# Patient Record
Sex: Female | Born: 1975 | Race: White | Hispanic: No | Marital: Married | Smoking: Current some day smoker
Health system: Southern US, Community
[De-identification: ages and names within clinical notes are randomized; demographics above are authoritative.]

## PROBLEM LIST (undated history)

## (undated) DIAGNOSIS — R55 Syncope and collapse: Secondary | ICD-10-CM

## (undated) DIAGNOSIS — J45909 Unspecified asthma, uncomplicated: Secondary | ICD-10-CM

## (undated) HISTORY — PX: PLACEMENT OF BREAST IMPLANTS: SHX6334

## (undated) HISTORY — PX: OTHER SURGICAL HISTORY: SHX169

## (undated) HISTORY — PX: APPENDECTOMY: SHX54

## (undated) HISTORY — PX: CHOLECYSTECTOMY: SHX55

## (undated) HISTORY — PX: AUGMENTATION MAMMAPLASTY: SUR837

## (undated) HISTORY — PX: TUBAL LIGATION: SHX77

## (undated) HISTORY — DX: Syncope and collapse: R55

---

## 1997-12-06 ENCOUNTER — Emergency Department (HOSPITAL_COMMUNITY): Admission: EM | Admit: 1997-12-06 | Discharge: 1997-12-06 | Payer: Self-pay | Admitting: Emergency Medicine

## 1998-08-12 ENCOUNTER — Emergency Department (HOSPITAL_COMMUNITY): Admission: EM | Admit: 1998-08-12 | Discharge: 1998-08-12 | Payer: Self-pay | Admitting: Emergency Medicine

## 1998-08-17 ENCOUNTER — Emergency Department (HOSPITAL_COMMUNITY): Admission: EM | Admit: 1998-08-17 | Discharge: 1998-08-17 | Payer: Self-pay | Admitting: Emergency Medicine

## 1999-06-08 ENCOUNTER — Encounter: Payer: Self-pay | Admitting: Emergency Medicine

## 1999-06-08 ENCOUNTER — Emergency Department (HOSPITAL_COMMUNITY): Admission: EM | Admit: 1999-06-08 | Discharge: 1999-06-08 | Payer: Self-pay | Admitting: Emergency Medicine

## 1999-06-08 ENCOUNTER — Encounter: Payer: Self-pay | Admitting: General Surgery

## 2000-07-28 ENCOUNTER — Emergency Department (HOSPITAL_COMMUNITY): Admission: EM | Admit: 2000-07-28 | Discharge: 2000-07-28 | Payer: Self-pay | Admitting: Emergency Medicine

## 2000-07-28 ENCOUNTER — Encounter: Payer: Self-pay | Admitting: Emergency Medicine

## 2000-08-12 ENCOUNTER — Emergency Department (HOSPITAL_COMMUNITY): Admission: EM | Admit: 2000-08-12 | Discharge: 2000-08-12 | Payer: Self-pay | Admitting: Emergency Medicine

## 2000-10-20 ENCOUNTER — Emergency Department (HOSPITAL_COMMUNITY): Admission: EM | Admit: 2000-10-20 | Discharge: 2000-10-20 | Payer: Self-pay

## 2001-02-26 ENCOUNTER — Emergency Department (HOSPITAL_COMMUNITY): Admission: EM | Admit: 2001-02-26 | Discharge: 2001-02-26 | Payer: Self-pay | Admitting: Emergency Medicine

## 2001-05-15 ENCOUNTER — Emergency Department (HOSPITAL_COMMUNITY): Admission: EM | Admit: 2001-05-15 | Discharge: 2001-05-15 | Payer: Self-pay

## 2001-06-11 ENCOUNTER — Emergency Department (HOSPITAL_COMMUNITY): Admission: EM | Admit: 2001-06-11 | Discharge: 2001-06-11 | Payer: Self-pay | Admitting: *Deleted

## 2002-06-08 ENCOUNTER — Emergency Department (HOSPITAL_COMMUNITY): Admission: EM | Admit: 2002-06-08 | Discharge: 2002-06-08 | Payer: Self-pay | Admitting: Emergency Medicine

## 2003-08-16 ENCOUNTER — Other Ambulatory Visit: Admission: RE | Admit: 2003-08-16 | Discharge: 2003-08-16 | Payer: Self-pay | Admitting: Family Medicine

## 2003-09-09 ENCOUNTER — Emergency Department (HOSPITAL_COMMUNITY): Admission: EM | Admit: 2003-09-09 | Discharge: 2003-09-09 | Payer: Self-pay | Admitting: Emergency Medicine

## 2004-07-23 ENCOUNTER — Emergency Department: Payer: Self-pay | Admitting: Emergency Medicine

## 2007-09-11 ENCOUNTER — Other Ambulatory Visit: Payer: Self-pay

## 2007-09-11 ENCOUNTER — Emergency Department: Payer: Self-pay | Admitting: Emergency Medicine

## 2007-09-15 ENCOUNTER — Ambulatory Visit: Payer: Self-pay | Admitting: Family Medicine

## 2011-08-02 DIAGNOSIS — R55 Syncope and collapse: Secondary | ICD-10-CM | POA: Insufficient documentation

## 2013-07-31 ENCOUNTER — Emergency Department: Payer: Self-pay | Admitting: Emergency Medicine

## 2013-07-31 LAB — CBC WITH DIFFERENTIAL/PLATELET
Basophil #: 0.1 10*3/uL (ref 0.0–0.1)
Basophil %: 0.7 %
Eosinophil #: 0.2 10*3/uL (ref 0.0–0.7)
Eosinophil %: 1.1 %
HCT: 42.8 % (ref 35.0–47.0)
HGB: 14.8 g/dL (ref 12.0–16.0)
Lymphocyte #: 2.8 10*3/uL (ref 1.0–3.6)
Lymphocyte %: 16.9 %
MCH: 32.2 pg (ref 26.0–34.0)
MCHC: 34.7 g/dL (ref 32.0–36.0)
MCV: 93 fL (ref 80–100)
Monocyte #: 0.9 x10 3/mm (ref 0.2–0.9)
Monocyte %: 5.5 %
Neutrophil #: 12.7 10*3/uL — ABNORMAL HIGH (ref 1.4–6.5)
Neutrophil %: 75.8 %
Platelet: 239 10*3/uL (ref 150–440)
RBC: 4.61 10*6/uL (ref 3.80–5.20)
RDW: 12.8 % (ref 11.5–14.5)
WBC: 16.8 10*3/uL — ABNORMAL HIGH (ref 3.6–11.0)

## 2013-07-31 LAB — COMPREHENSIVE METABOLIC PANEL
Albumin: 4.1 g/dL (ref 3.4–5.0)
Alkaline Phosphatase: 57 U/L
Anion Gap: 6 — ABNORMAL LOW (ref 7–16)
BUN: 12 mg/dL (ref 7–18)
Bilirubin,Total: 0.4 mg/dL (ref 0.2–1.0)
Calcium, Total: 9.7 mg/dL (ref 8.5–10.1)
Chloride: 105 mmol/L (ref 98–107)
Co2: 24 mmol/L (ref 21–32)
Creatinine: 0.78 mg/dL (ref 0.60–1.30)
EGFR (African American): >60
EGFR (Non-African Amer.): >60
Glucose: 130 mg/dL — ABNORMAL HIGH (ref 65–99)
Osmolality: 272 (ref 275–301)
Potassium: 4 mmol/L (ref 3.5–5.1)
SGOT(AST): 28 U/L (ref 15–37)
SGPT (ALT): 24 U/L (ref 12–78)
Sodium: 135 mmol/L — ABNORMAL LOW (ref 136–145)
Total Protein: 7.5 g/dL (ref 6.4–8.2)

## 2013-07-31 LAB — URINALYSIS, COMPLETE
Bacteria: NONE SEEN
Bilirubin,UR: NEGATIVE
Blood: NEGATIVE
Glucose,UR: NEGATIVE mg/dL (ref 0–75)
Ketone: NEGATIVE
Leukocyte Esterase: NEGATIVE
Nitrite: NEGATIVE
Ph: 5 (ref 4.5–8.0)
Protein: NEGATIVE
RBC,UR: <1 /HPF (ref 0–5)
Specific Gravity: 1.024 (ref 1.003–1.030)
Squamous Epithelial: NONE SEEN
WBC UR: 1 /HPF (ref 0–5)

## 2013-08-01 ENCOUNTER — Observation Stay: Payer: Self-pay | Admitting: Surgery

## 2013-08-01 HISTORY — PX: LAPAROSCOPIC APPENDECTOMY: SHX408

## 2013-08-01 LAB — COMPREHENSIVE METABOLIC PANEL
Albumin: 4 g/dL (ref 3.4–5.0)
Alkaline Phosphatase: 69 U/L
Anion Gap: 3 — ABNORMAL LOW (ref 7–16)
BUN: 12 mg/dL (ref 7–18)
Bilirubin,Total: 0.8 mg/dL (ref 0.2–1.0)
Calcium, Total: 8.9 mg/dL (ref 8.5–10.1)
Chloride: 103 mmol/L (ref 98–107)
Co2: 28 mmol/L (ref 21–32)
Creatinine: 0.84 mg/dL (ref 0.60–1.30)
EGFR (African American): >60
EGFR (Non-African Amer.): >60
Glucose: 117 mg/dL — ABNORMAL HIGH (ref 65–99)
Osmolality: 269 (ref 275–301)
Potassium: 4 mmol/L (ref 3.5–5.1)
SGOT(AST): 25 U/L (ref 15–37)
SGPT (ALT): 22 U/L (ref 12–78)
Sodium: 134 mmol/L — ABNORMAL LOW (ref 136–145)
Total Protein: 7.7 g/dL (ref 6.4–8.2)

## 2013-08-01 LAB — URINALYSIS, COMPLETE
BACTERIA: NONE SEEN
BILIRUBIN, UR: NEGATIVE
Blood: NEGATIVE
Glucose,UR: NEGATIVE mg/dL (ref 0–75)
Ketone: NEGATIVE
Leukocyte Esterase: NEGATIVE
Nitrite: NEGATIVE
Ph: 5 (ref 4.5–8.0)
Protein: NEGATIVE
RBC,UR: 1 /HPF (ref 0–5)
Specific Gravity: 1.005 (ref 1.003–1.030)
WBC UR: <1 /HPF (ref 0–5)

## 2013-08-01 LAB — CBC WITH DIFFERENTIAL/PLATELET
Basophil #: 0.1 10*3/uL (ref 0.0–0.1)
Basophil %: 0.7 %
Eosinophil #: 0.2 10*3/uL (ref 0.0–0.7)
Eosinophil %: 1.2 %
HCT: 42.6 % (ref 35.0–47.0)
HGB: 14.6 g/dL (ref 12.0–16.0)
Lymphocyte #: 2.3 10*3/uL (ref 1.0–3.6)
Lymphocyte %: 16.8 %
MCH: 32.1 pg (ref 26.0–34.0)
MCHC: 34.2 g/dL (ref 32.0–36.0)
MCV: 94 fL (ref 80–100)
Monocyte #: 0.9 x10 3/mm (ref 0.2–0.9)
Monocyte %: 6.2 %
Neutrophil #: 10.4 10*3/uL — ABNORMAL HIGH (ref 1.4–6.5)
Neutrophil %: 75.1 %
Platelet: 233 10*3/uL (ref 150–440)
RBC: 4.54 10*6/uL (ref 3.80–5.20)
RDW: 12.6 % (ref 11.5–14.5)
WBC: 13.8 10*3/uL — ABNORMAL HIGH (ref 3.6–11.0)

## 2013-08-01 LAB — LIPASE, BLOOD: Lipase: 122 U/L (ref 73–393)

## 2013-08-03 LAB — PATHOLOGY REPORT

## 2014-11-19 NOTE — Op Note (Signed)
PATIENT NAME:  Taylor HaberURNER, Jianni N MR#:  [id number] DATE OF BIRTH:  [birth year]  DATE OF PROCEDURE:  08/01/2013  PREOPERATIVE DIAGNOSIS: Acute retrocecal appendicitis.   POSTOPERATIVE DIAGNOSIS: Acute retrocecal appendicitis.    PROCEDURE PERFORMED: Laparoscopic appendectomy.   SURGEON: French Kendra A. Egbert GaribaldiBird, M.D.   ASSISTANT: Surgical scrub technologist.   TYPE OF ANESTHESIA: General oroendotracheal.   FINDINGS: Retrocecal appendicitis with early gangrenous changes in the tip of the appendix.   SPECIMEN: Appendix to pathology.   ESTIMATED BLOOD LOSS: 25 mL.   DRAINS: None.   COUNTS: Lap and needle counts correct x 2.   DESCRIPTION OF PROCEDURE: With informed consent, supine position and general oroendotracheal anesthesia, the patient's abdomen was widely prepped and draped with ChloraPrep solution. The left arm was tucked and padded at her side, and timeout was observed.   A 12 mm Hasson trocar was placed through an open technique through an infraumbilical transversely-oriented skin incision, stay sutures being passed through the abdominal midline fascia. Pneumoperitoneum was established. Laparoscopic evaluation of the abdomen demonstrated no findings consistent with free perforation. A 5 mm Bladeless trocar was placed in the right upper mid abdomen. A 5 mm Bladeless trocar was placed in the left lower mid abdomen. The patient was then positioned in Trendelenburg and airplaned right side up. The base of the appendix was clearly identified. A window was fashioned at the base of the appendix with blunt technique. Utilizing an endoscopic 35 mm blue load stapler, the base of the appendix was transected at the confluence of the tinea. The appendix was in a retrocecal orientation. The mesoappendix was carefully divided dissecting it off the pericolonic fat as well as retroperitoneal attachments and this was achieved with the use of the laparoscopic Harmonic scalpel. The right upper quadrant and right  pericolic gutter were copiously irrigated and aspirated dry. Hemostasis appeared to be adequate on the operative field. The specimen was captured in an Endo Catch device and retrieved. Evaluation of the abdomen at this point demonstrated no evidence of bowel injury. Hemostasis appeared to be adequate.   Ports were then removed. The infraumbilical fascial defect was closed with several interrupted #0 Vicryl sutures on UR5 application. A total of 30 mL of 0.25% plain Marcaine was infiltrated along all skin and fascial incisions prior to closure. A 4-0 Vicryl subcuticular was applied to all skin edges. Benzoin, Steri-Strips, Telfa and Tegaderm were then applied, and the patient was subsequently extubated and taken to the recovery room in stable and satisfactory condition by anesthesia services.   ____________________________ Redge GainerMark A. Egbert GaribaldiBird, MD mab:gb D: 08/01/2013 16:44:12 ET T: 08/01/2013 23:20:55 ET JOB#: [id number]  cc: Loraine LericheMark A. Egbert GaribaldiBird, MD, <Dictator> Laramie Meissner A Kamrin Sibley MD ELECTRONICALLY SIGNED 08/02/2013 7:18

## 2014-11-19 NOTE — H&P (Signed)
Subjective/Chief Complaint abdominal pain nausea and vomiting   History of Present Illness 39 y/o female with hx of neurocardiogenic syncope presents with 5 day history of nausea, emesis and worsening abdominal pain pain initially in RUQ then today more in here right mid abdomen and flank, no sick contacts, no diarrhea, no dysuria   Past History as above tubal ligation abdominoplasty.   Past Med/Surgical Hx:  cardiac cath:   Asthma:   bradycardia:   Tachycardia:   cardiac arrythmia:   orthostatic hypertension:   nuerocardiogenic ssyncope:   Breast Surgery: implants  Tubal Ligation:   Wisdom Tooth Extraction:   ALLERGIES:  Compazine: Unknown  HOME MEDICATIONS: Medication Instructions Status  ondansetron 4 mg tablet 1 tab(s) orally every 8 hours, As Needed Active  acetaminophen-oxyCODONE 325 mg-5 mg tablet 1 tab(s) orally every 4 hours- as needed for pain  Active  proamatine 41m three times a day  Active  flecainide 1062mtwice a day  Active  zoloft 7577maily  Active  acebutolol 200 mg oral capsule 1 cap(s) orally 2 times a day Active   Family and Social History:  Family History Non-Contributory   Social History positive  tobacco, negative ETOH   + Tobacco Current (within 1 year)   Place of Living Home   Review of Systems:  Subjective/Chief Complaint see above   Abdominal Pain Yes   Diarrhea No   SOB/DOE Yes   Tolerating Diet No  Nauseated   Medications/Allergies Reviewed Medications/Allergies reviewed   Physical Exam:  GEN no acute distress   HEENT pale conjunctivae, PERRL   NECK supple   RESP normal resp effort  clear BS   CARD regular rate   ABD positive tenderness  no hernia  soft  multiple scars, mild rovsings sign.   LYMPH negative neck   EXTR negative cyanosis/clubbing   SKIN normal to palpation   NEURO cranial nerves intact   PSYCH A+O to time, place, person, good insight   Lab Results: Hepatic:  04-Jan-15 09:18    Bilirubin, Total 0.8  Alkaline Phosphatase 69 (45-117 NOTE: New Reference Range 06/18/13)  SGPT (ALT) 22  SGOT (AST) 25  Total Protein, Serum 7.7  Albumin, Serum 4.0  Routine Chem:  04-Jan-15 09:17   Result Comment URINALYSIS - CANCELLED. NOT ENOUGH SAMPLE.  - MPG C/ BRIDGETT RN @ [id number]/15  - TO NOTIFY.  Result(s) reported on 01 Aug 2013 at 09:28AM.    09:18   Glucose, Serum  117  BUN 12  Creatinine (comp) 0.84  Sodium, Serum  134  Potassium, Serum 4.0  Chloride, Serum 103  CO2, Serum 28  Calcium (Total), Serum 8.9  Osmolality (calc) 269  eGFR (African American) >60  eGFR (Non-African American) >60 (eGFR values <93m10mn/1.73 m2 may be an indication of chronic kidney disease (CKD). Calculated eGFR is useful in patients with stable renal function. The eGFR calculation will not be reliable in acutely ill patients when serum creatinine is changing rapidly. It is not useful in  patients on dialysis. The eGFR calculation may not be applicable to patients at the low and high extremes of body sizes, pregnant women, and vegetarians.)  Anion Gap  3  Lipase 122 (Result(s) reported on 01 Aug 2013 at 09:47AM.)  Routine UA:  04-Jan-15 09:17   Color (UA) -  Clarity (UA) -  Glucose (UA) -  Bilirubin (UA) -  Ketones (UA) -  Specific Gravity (UA) -  Blood (UA) -  pH (UA) -  Protein (UA) -  Nitrite (UA) -  Leukocyte Esterase (UA) -  RBC (UA) -  WBC (UA) -  Bacteria (UA) -  Epithelial Cells (UA) -  Other 1 (UA) -  Other 2 (UA) -  Other 3 (UA) -  Other 4 (UA) -  Other 5 (UA) -  Other 6 (UA) - (Result(s) reported on 01 Aug 2013 at 09:28AM.)  Transitional Epithelial (UA) -  Renal Epithelial (UA) -  WBC Clump (UA) -  Dysmorphic Red Blood Cell (UA) -  Red Blood Cell Clump (UA) -  Mucous (UA) -  Trichomonas (UA) -  Budding Yeast (UA) -  Hyphae Yeast -  Hyaline Cast (UA) -  Granular Cast (UA) -  Epithelial Cast (UA) -  WBC Cast (UA) -  RBC Cast (UA) -  Cellular Cast  (UA) -  Broad Cast (UA) -  Waxy Cast (UA) -  Fatty Cast (UA) -  Amorphous Crystal (UA) -  Triple Phosphate Crystal (UA) -  Calcium Oxalate Crystal (UA) -  Uric Acid Crystal (UA) -  Calcium Phosphate Crystal (UA) -  Calcium Carbonate Crystal (UA) -  Cystine Crystal (UA) -  Leucine Crystal (UA) -  Tyrosine Crystal (UA) -  Fat (UA) -  Oval Fat Body (UA) -  Routine Hem:  04-Jan-15 09:18   WBC (CBC)  13.8  RBC (CBC) 4.54  Hemoglobin (CBC) 14.6  Hematocrit (CBC) 42.6  Platelet Count (CBC) 233  MCV 94  MCH 32.1  MCHC 34.2  RDW 12.6  Neutrophil % 75.1  Lymphocyte % 16.8  Monocyte % 6.2  Eosinophil % 1.2  Basophil % 0.7  Neutrophil #  10.4  Lymphocyte # 2.3  Monocyte # 0.9  Eosinophil # 0.2  Basophil # 0.1 (Result(s) reported on 01 Aug 2013 at 09:36AM.)   Radiology Results: XRay:    03-Jan-15 07:54, Chest PA and Lateral  Chest PA and Lateral  REASON FOR EXAM:    R upper quadrant pain - elevated WBC  COMMENTS:       PROCEDURE: DXR - DXR CHEST PA (OR AP) AND LATERAL  - Jul 31 2013  7:54AM     CLINICAL DATA:  Right upper quadrant pain, leukocytosis    EXAM:  CHEST  2 VIEW    COMPARISON:  Right upper quadrant ultrasound performed earlier  today; report from a remote Chest x-ray dated September 18, 2001 is  available for review    FINDINGS:  The lungs are clear and negative for focal airspace consolidation,  pulmonary edema or suspicious pulmonary nodule. No pleural effusion  or pneumothorax. Cardiac and mediastinal contours are within normal  limits. No acute fracture or lytic or blastic osseous lesions. The  visualized upper abdominal bowel gas pattern is unremarkable.     IMPRESSION:  No active cardiopulmonary disease.      Electronically Signed    By: Jacqulynn Cadet M.D.    On: 07/31/2013 07:59       Verified By: Criselda Peaches, M.D.,  [place]:    03-Jan-15 07:14, US Abdomen Limited Survey  US Abdomen Limited Survey  REASON FOR EXAM:    severe RUQ  pain, h/o "sludge"  COMMENTS:   Body Site: Gallbladder, Liver, Common Bile Duct    PROCEDURE: [place]  - US ABDOMEN LIMITED SURVEY  - Jul 31 2013  7:14AM     CLINICAL DATA:  Severe right upper quadrant pain, history of  gallbladder sludge    EXAM:  US ABDOMEN LIMITED - RIGHT UPPER QUADRANT  COMPARISON:  Prior abdominal ultrasound 09/09/2003    FINDINGS:  Gallbladder  No gallstones or wall thickening visualized. No sonographic Murphy  sign noted.    Common bile duct    Diameter: Within normal limits 3.2 mm.    Liver:    No focal lesion identified. Within normal limits in parenchymal  echogenicity. Patent main portal vein with normal hepatopetal flow.     IMPRESSION:  Normal right upper quadrant ultrasound.  Electronically Signed    By: Jacqulynn Cadet M.D.    On: 07/31/2013 07:18         Verified By: Criselda Peaches, M.D.,  LabUnknown:  PACS Image    03-Jan-15 07:54, Chest PA and Lateral  PACS Image    04-Jan-15 12:14, CT Abdomen and Pelvis With Contrast  PACS Image  CT:  CT Abdomen and Pelvis With Contrast  REASON FOR EXAM:    (1) abd pain - R abd - moving into R lower; (2) abd   pain - R abd - moving into R  COMMENTS:       PROCEDURE: CT  - CT ABDOMEN / PELVIS  W  - Aug 01 2013 12:14PM     CLINICAL DATA:  Abdominal pain.    EXAM:  CT ABDOMEN AND PELVISWITH CONTRAST    TECHNIQUE:  Multidetector CT imaging of the abdomen and pelvis was performed  using the standard protocol following bolus administration of  intravenous contrast.  CONTRAST:  125 mm of Isovue 370.    COMPARISON:  No priors.    FINDINGS:  Lung Bases: Bilateral breast implants.  Otherwise, unremarkable.    Abdomen/Pelvis: The appearance of the liver, gallbladder, pancreas,  bilateral adrenal glands and the right kidney is unremarkable. Tiny  sub cm low-attenuation lesions in the upper pole of the right kidney  are too small to definitively characterize, but are  statistically  likely to represent tiny cysts. There is a 8 mm indeterminate  low-attenuation lesion in the spleen, which is incompletely  characterized, but likely a benign lesion.  The appendix is retrocecal in position. The tip of the appendix  appears dilated and markedly inflamed, measuring up to 1.3 cm in  diameter. There is surrounding stranding in the retroperitoneal fat  posterior to the hepatic flexure of the colon and anterior to the  right kidney. Small locules of gas in this region appear to be in  the tip of the appendix. The majority of the appendiceal mucosa  enhances normally, however, in the region of the tip of the appendix  medially there is asmall focus of nonenhancement, compatible with  an ischemic region.    No significant volume of ascites. No pneumoperitoneum. No pathologic  distention of small bowel. No definite lymphadenopathy identified  within the abdomen or pelvis. Uterus and ovaries are unremarkable in  appearance. A tampon is present in the vagina. Urinary bladder is  unremarkable in appearance.  Musculoskeletal: There are no aggressive appearing lytic or blastic  lesions noted in the visualized portions of the skeleton.     IMPRESSION:  1. The appendix is retrocecal in position, and there is definite  evidence of severe acute tip appendicitis. The appearance could  suggest early perforation, however, this is not yet definitive.  Surgical consultation is strongly recommended.  Critical Value/emergent results were called by telephone at the time  of interpretation on 08/01/2013 at 12:24 PM to Dr. Francene Castle, who  verbally acknowledged these results.      Electronically Signed  By: Vinnie Langton M.D.    On: 08/01/2013 12:28         Verified By: Etheleen Mayhew, M.D.,    Assessment/Admission Diagnosis 39 y/o with retrocecal appendicitis   Plan lap appendectomy, discussed retrocecal nature of anatomy. IV zosn, npo.   Electronic  Signatures: Sherri Rad (MD)  (Signed 04-Jan-15 13:35)  Authored: CHIEF COMPLAINT and HISTORY, PAST MEDICAL/SURGIAL HISTORY, ALLERGIES, HOME MEDICATIONS, FAMILY AND SOCIAL HISTORY, REVIEW OF SYSTEMS, PHYSICAL EXAM, LABS, Radiology, ASSESSMENT AND PLAN   Last Updated: 04-Jan-15 13:35 by Sherri Rad (MD)

## 2015-09-13 DIAGNOSIS — G90A Postural orthostatic tachycardia syndrome (POTS): Secondary | ICD-10-CM | POA: Insufficient documentation

## 2015-09-13 DIAGNOSIS — J45909 Unspecified asthma, uncomplicated: Secondary | ICD-10-CM | POA: Insufficient documentation

## 2015-09-13 DIAGNOSIS — R000 Tachycardia, unspecified: Secondary | ICD-10-CM | POA: Insufficient documentation

## 2019-08-11 ENCOUNTER — Ambulatory Visit: Payer: Medicare Other | Attending: Internal Medicine

## 2019-08-11 DIAGNOSIS — Z20822 Contact with and (suspected) exposure to covid-19: Secondary | ICD-10-CM

## 2019-08-12 LAB — NOVEL CORONAVIRUS, NAA: SARS-CoV-2, NAA: NOT DETECTED

## 2019-09-02 ENCOUNTER — Ambulatory Visit: Payer: Medicare Other | Attending: Internal Medicine

## 2019-09-02 DIAGNOSIS — Z23 Encounter for immunization: Secondary | ICD-10-CM | POA: Insufficient documentation

## 2019-09-02 NOTE — Progress Notes (Signed)
   Covid-19 Vaccination Clinic  Name:  Taylor Copeland    MRN: [id number] DOB: 1975/12/01  09/02/2019  Taylor Copeland was observed post Covid-19 immunization for 15 minutes without incidence. She was provided with Vaccine Information Sheet and instruction to access the V-Safe system.   Taylor Copeland was instructed to call 911 with any severe reactions post vaccine: Marland Kitchen Difficulty breathing  . Swelling of your face and throat  . A fast heartbeat  . A bad rash all over your body  . Dizziness and weakness    Immunizations Administered    Name Date Dose VIS Date Route   Pfizer COVID-19 Vaccine 09/02/2019  5:50 PM 0.3 mL 07/09/2019 Intramuscular   Manufacturer: [employer], [employer]   Lot: [id number]   NDC: [address]-9

## 2019-09-28 ENCOUNTER — Ambulatory Visit: Payer: Medicare Other | Attending: Internal Medicine

## 2019-09-28 DIAGNOSIS — Z23 Encounter for immunization: Secondary | ICD-10-CM | POA: Insufficient documentation

## 2019-09-28 NOTE — Progress Notes (Signed)
   Covid-19 Vaccination Clinic  Name:  BENA KOBEL    MRN: [id number] DOB: 1976-06-10  09/28/2019  Ms. Dekoning was observed post Covid-19 immunization for 15 minutes without incident. She was provided with Vaccine Information Sheet and instruction to access the V-Safe system.   Ms. Manganelli was instructed to call 911 with any severe reactions post vaccine: Marland Kitchen Difficulty breathing  . Swelling of face and throat  . A fast heartbeat  . A bad rash all over body  . Dizziness and weakness   Immunizations Administered    Name Date Dose VIS Date Route   Pfizer COVID-19 Vaccine 09/28/2019  9:10 AM 0.3 mL 07/09/2019 Intramuscular   Manufacturer: [employer], [employer]   Lot: [id number]   NDC: [address]-7

## 2020-02-11 ENCOUNTER — Encounter
Admission: AD | Disposition: A | Discharge status: Home / Self Care | Payer: Self-pay | Source: Ambulatory Visit | Attending: Surgery

## 2020-02-11 ENCOUNTER — Other Ambulatory Visit: Payer: Self-pay

## 2020-02-11 ENCOUNTER — Observation Stay
Admission: AD | Admit: 2020-02-11 | Discharge: 2020-02-12 | Disposition: A | Discharge status: Home / Self Care | Payer: Medicare Other | Source: Ambulatory Visit | Attending: Surgery | Admitting: Surgery

## 2020-02-11 ENCOUNTER — Encounter: Payer: Self-pay | Admitting: Surgery

## 2020-02-11 ENCOUNTER — Ambulatory Visit (INDEPENDENT_AMBULATORY_CARE_PROVIDER_SITE_OTHER): Payer: Medicare Other | Admitting: Surgery

## 2020-02-11 ENCOUNTER — Observation Stay: Payer: Medicare Other | Admitting: Anesthesiology

## 2020-02-11 ENCOUNTER — Other Ambulatory Visit: Payer: Medicare Other

## 2020-02-11 DIAGNOSIS — K439 Ventral hernia without obstruction or gangrene: Principal | ICD-10-CM | POA: Insufficient documentation

## 2020-02-11 DIAGNOSIS — Z20822 Contact with and (suspected) exposure to covid-19: Secondary | ICD-10-CM | POA: Diagnosis not present

## 2020-02-11 DIAGNOSIS — Z79899 Other long term (current) drug therapy: Secondary | ICD-10-CM | POA: Diagnosis not present

## 2020-02-11 DIAGNOSIS — R1033 Periumbilical pain: Secondary | ICD-10-CM | POA: Diagnosis present

## 2020-02-11 DIAGNOSIS — K436 Other and unspecified ventral hernia with obstruction, without gangrene: Secondary | ICD-10-CM | POA: Diagnosis not present

## 2020-02-11 HISTORY — PX: XI ROBOTIC ASSISTED VENTRAL HERNIA: SHX6789

## 2020-02-11 LAB — COMPREHENSIVE METABOLIC PANEL
ALT: 12 U/L (ref 0–44)
AST: 16 U/L (ref 15–41)
Albumin: 4.2 g/dL (ref 3.5–5.0)
Alkaline Phosphatase: 35 U/L — ABNORMAL LOW (ref 38–126)
Anion gap: 8 (ref 5–15)
BUN: 12 mg/dL (ref 6–20)
CO2: 22 mmol/L (ref 22–32)
Calcium: 8.8 mg/dL — ABNORMAL LOW (ref 8.9–10.3)
Chloride: 106 mmol/L (ref 98–111)
Creatinine, Ser: 0.68 mg/dL (ref 0.44–1.00)
GFR calc Af Amer: >60 mL/min (ref >60)
GFR calc non Af Amer: >60 mL/min (ref >60)
Glucose, Bld: 84 mg/dL (ref 70–99)
Potassium: 3.7 mmol/L (ref 3.5–5.1)
Sodium: 136 mmol/L (ref 135–145)
Total Bilirubin: 0.8 mg/dL (ref 0.3–1.2)
Total Protein: 6.8 g/dL (ref 6.5–8.1)

## 2020-02-11 LAB — URINE DRUG SCREEN, QUALITATIVE (ARMC ONLY)
Amphetamines, Ur Screen: NOT DETECTED
Barbiturates, Ur Screen: NOT DETECTED
Benzodiazepine, Ur Scrn: NOT DETECTED
Cannabinoid 50 Ng, Ur ~~LOC~~: POSITIVE — AB
Cocaine Metabolite,Ur ~~LOC~~: NOT DETECTED
MDMA (Ecstasy)Ur Screen: NOT DETECTED
Methadone Scn, Ur: NOT DETECTED
Opiate, Ur Screen: POSITIVE — AB
Phencyclidine (PCP) Ur S: NOT DETECTED
Tricyclic, Ur Screen: NOT DETECTED

## 2020-02-11 LAB — CBC
HCT: 35.9 % — ABNORMAL LOW (ref 36.0–46.0)
Hemoglobin: 12.9 g/dL (ref 12.0–15.0)
MCH: 32 pg (ref 26.0–34.0)
MCHC: 35.9 g/dL (ref 30.0–36.0)
MCV: 89.1 fL (ref 80.0–100.0)
Platelets: 219 10*3/uL (ref 150–400)
RBC: 4.03 MIL/uL (ref 3.87–5.11)
RDW: 12.1 % (ref 11.5–15.5)
WBC: 9.2 10*3/uL (ref 4.0–10.5)
nRBC: 0 % (ref 0.0–0.2)

## 2020-02-11 LAB — SARS CORONAVIRUS 2 BY RT PCR (HOSPITAL ORDER, PERFORMED IN ~~LOC~~ HOSPITAL LAB): SARS Coronavirus 2: NEGATIVE

## 2020-02-11 LAB — PROTIME-INR
INR: 1 (ref 0.8–1.2)
Prothrombin Time: 12.8 seconds (ref 11.4–15.2)

## 2020-02-11 LAB — POCT PREGNANCY, URINE: Preg Test, Ur: NEGATIVE

## 2020-02-11 LAB — PREGNANCY, URINE: Preg Test, Ur: NEGATIVE

## 2020-02-11 SURGERY — REPAIR, HERNIA, VENTRAL, ROBOT-ASSISTED
Anesthesia: General

## 2020-02-11 MED ORDER — CHLORHEXIDINE GLUCONATE 0.12 % MT SOLN: 15.0000 mL | Freq: Once | OROMUCOSAL | Status: AC

## 2020-02-11 MED ORDER — ONDANSETRON 4 MG PO TBDP: 4.0000 mg | ORAL_TABLET | Freq: Four times a day (QID) | ORAL | Status: DC | PRN

## 2020-02-11 MED ORDER — SODIUM CHLORIDE 0.9 % IV SOLN
3.0000 g | Freq: Four times a day (QID) | INTRAVENOUS | Status: DC
  Administered 2020-02-11 – 2020-02-12 (×4): 3 g via INTRAVENOUS
  Filled 2020-02-11 (×6): qty 8
  Filled 2020-02-11: qty 3
  Filled 2020-02-11: qty 8
  Filled 2020-02-11: qty 3

## 2020-02-11 MED ORDER — PROMETHAZINE HCL 25 MG/ML IJ SOLN: 6.2500 mg | INTRAMUSCULAR | Status: DC | PRN

## 2020-02-11 MED ORDER — KETOROLAC TROMETHAMINE 30 MG/ML IJ SOLN
INTRAMUSCULAR | Status: AC
  Administered 2020-02-11: 30 mg via INTRAVENOUS
  Filled 2020-02-11: qty 1

## 2020-02-11 MED ORDER — FENTANYL CITRATE (PF) 100 MCG/2ML IJ SOLN
INTRAMUSCULAR | Status: AC
  Administered 2020-02-11: 25 ug via INTRAVENOUS
  Filled 2020-02-11: qty 2

## 2020-02-11 MED ORDER — ACETAMINOPHEN 500 MG PO TABS
1000.0000 mg | ORAL_TABLET | Freq: Four times a day (QID) | ORAL | Status: DC
  Administered 2020-02-12 (×3): 1000 mg via ORAL
  Filled 2020-02-11 (×3): qty 2

## 2020-02-11 MED ORDER — BUPIVACAINE LIPOSOME 1.3 % IJ SUSP
INTRAMUSCULAR | Status: DC | PRN
  Administered 2020-02-11: 20 mL

## 2020-02-11 MED ORDER — ONDANSETRON HCL 4 MG/2ML IJ SOLN
INTRAMUSCULAR | Status: AC
  Filled 2020-02-11: qty 2

## 2020-02-11 MED ORDER — FENTANYL CITRATE (PF) 100 MCG/2ML IJ SOLN
25.0000 ug | INTRAMUSCULAR | Status: DC | PRN
  Administered 2020-02-11: 25 ug via INTRAVENOUS
  Administered 2020-02-11: 50 ug via INTRAVENOUS

## 2020-02-11 MED ORDER — MIDAZOLAM HCL 2 MG/2ML IJ SOLN
INTRAMUSCULAR | Status: DC | PRN
  Administered 2020-02-11: 2 mg via INTRAVENOUS

## 2020-02-11 MED ORDER — MIDAZOLAM HCL 2 MG/2ML IJ SOLN
INTRAMUSCULAR | Status: AC
  Filled 2020-02-11: qty 2

## 2020-02-11 MED ORDER — FENTANYL CITRATE (PF) 100 MCG/2ML IJ SOLN
INTRAMUSCULAR | Status: AC
  Filled 2020-02-11: qty 2

## 2020-02-11 MED ORDER — ONDANSETRON HCL 4 MG/2ML IJ SOLN
INTRAMUSCULAR | Status: DC | PRN
  Administered 2020-02-11: 4 mg via INTRAVENOUS

## 2020-02-11 MED ORDER — LACTATED RINGERS IV SOLN: INTRAVENOUS | Status: DC

## 2020-02-11 MED ORDER — DEXAMETHASONE SODIUM PHOSPHATE 10 MG/ML IJ SOLN
INTRAMUSCULAR | Status: AC
  Filled 2020-02-11: qty 1

## 2020-02-11 MED ORDER — BUPIVACAINE LIPOSOME 1.3 % IJ SUSP
INTRAMUSCULAR | Status: AC
  Filled 2020-02-11: qty 20

## 2020-02-11 MED ORDER — ONDANSETRON HCL 4 MG/2ML IJ SOLN
4.0000 mg | Freq: Four times a day (QID) | INTRAMUSCULAR | Status: DC | PRN
  Administered 2020-02-11 (×2): 4 mg via INTRAVENOUS
  Filled 2020-02-11: qty 2

## 2020-02-11 MED ORDER — HYDROMORPHONE HCL 1 MG/ML IJ SOLN: 0.5000 mg | INTRAMUSCULAR | Status: DC | PRN

## 2020-02-11 MED ORDER — SUGAMMADEX SODIUM 200 MG/2ML IV SOLN
INTRAVENOUS | Status: DC | PRN
  Administered 2020-02-11: 200 mg via INTRAVENOUS

## 2020-02-11 MED ORDER — FENTANYL CITRATE (PF) 100 MCG/2ML IJ SOLN
INTRAMUSCULAR | Status: AC
  Administered 2020-02-11: 50 ug via INTRAVENOUS
  Filled 2020-02-11: qty 2

## 2020-02-11 MED ORDER — PROPOFOL 10 MG/ML IV BOLUS
INTRAVENOUS | Status: AC
  Filled 2020-02-11: qty 20

## 2020-02-11 MED ORDER — ROCURONIUM BROMIDE 100 MG/10ML IV SOLN
INTRAVENOUS | Status: DC | PRN
  Administered 2020-02-11: 30 mg via INTRAVENOUS
  Administered 2020-02-11: 50 mg via INTRAVENOUS
  Administered 2020-02-11: 20 mg via INTRAVENOUS

## 2020-02-11 MED ORDER — PROPOFOL 10 MG/ML IV BOLUS
INTRAVENOUS | Status: DC | PRN
  Administered 2020-02-11: 150 mg via INTRAVENOUS
  Administered 2020-02-11: 50 mg via INTRAVENOUS
  Administered 2020-02-11: 100 mg via INTRAVENOUS

## 2020-02-11 MED ORDER — KETOROLAC TROMETHAMINE 30 MG/ML IJ SOLN
30.0000 mg | Freq: Four times a day (QID) | INTRAMUSCULAR | Status: DC
  Administered 2020-02-12 (×3): 30 mg via INTRAVENOUS
  Filled 2020-02-11 (×3): qty 1

## 2020-02-11 MED ORDER — BUPIVACAINE-EPINEPHRINE 0.25% -1:200000 IJ SOLN
INTRAMUSCULAR | Status: DC | PRN
  Administered 2020-02-11: 30 mL

## 2020-02-11 MED ORDER — DEXAMETHASONE SODIUM PHOSPHATE 10 MG/ML IJ SOLN
INTRAMUSCULAR | Status: DC | PRN
  Administered 2020-02-11: 10 mg via INTRAVENOUS

## 2020-02-11 MED ORDER — FENTANYL CITRATE (PF) 100 MCG/2ML IJ SOLN
50.0000 ug | Freq: Once | INTRAMUSCULAR | Status: AC
  Administered 2020-02-11: 50 ug via INTRAVENOUS

## 2020-02-11 MED ORDER — PANTOPRAZOLE SODIUM 40 MG IV SOLR
40.0000 mg | Freq: Every day | INTRAVENOUS | Status: DC
  Administered 2020-02-11: 40 mg via INTRAVENOUS
  Filled 2020-02-11: qty 40

## 2020-02-11 MED ORDER — OXYCODONE HCL 5 MG PO TABS
5.0000 mg | ORAL_TABLET | ORAL | Status: DC | PRN
  Administered 2020-02-11 – 2020-02-12 (×3): 10 mg via ORAL
  Filled 2020-02-11 (×3): qty 2

## 2020-02-11 MED ORDER — LACTATED RINGERS IV SOLN
125.0000 mL/h | INTRAVENOUS | Status: DC
  Administered 2020-02-11: 125 mL/h via INTRAVENOUS

## 2020-02-11 MED ORDER — PHENYLEPHRINE HCL (PRESSORS) 10 MG/ML IV SOLN
INTRAVENOUS | Status: DC | PRN
  Administered 2020-02-11 (×2): 100 ug via INTRAVENOUS

## 2020-02-11 MED ORDER — POLYETHYLENE GLYCOL 3350 17 G PO PACK
17.0000 g | PACK | Freq: Every day | ORAL | Status: DC | PRN
  Administered 2020-02-12: 17 g via ORAL
  Filled 2020-02-11: qty 1

## 2020-02-11 MED ORDER — EPHEDRINE SULFATE 50 MG/ML IJ SOLN
INTRAMUSCULAR | Status: DC | PRN
  Administered 2020-02-11 (×3): 15 mg via INTRAVENOUS
  Administered 2020-02-11: 10 mg via INTRAVENOUS
  Administered 2020-02-11: 15 mg via INTRAVENOUS
  Administered 2020-02-11: 10 mg via INTRAVENOUS

## 2020-02-11 MED ORDER — FENTANYL CITRATE (PF) 100 MCG/2ML IJ SOLN
INTRAMUSCULAR | Status: DC | PRN
  Administered 2020-02-11: 50 ug via INTRAVENOUS
  Administered 2020-02-11: 100 ug via INTRAVENOUS
  Administered 2020-02-11: 50 ug via INTRAVENOUS

## 2020-02-11 MED ORDER — ORAL CARE MOUTH RINSE: 15.0000 mL | Freq: Once | OROMUCOSAL | Status: AC

## 2020-02-11 MED ORDER — LIDOCAINE HCL (CARDIAC) PF 100 MG/5ML IV SOSY
PREFILLED_SYRINGE | INTRAVENOUS | Status: DC | PRN
  Administered 2020-02-11: 100 mg via INTRAVENOUS

## 2020-02-11 MED ORDER — CHLORHEXIDINE GLUCONATE 0.12 % MT SOLN
OROMUCOSAL | Status: AC
  Administered 2020-02-11: 15 mL via OROMUCOSAL
  Filled 2020-02-11: qty 15

## 2020-02-11 MED ORDER — ENOXAPARIN SODIUM 40 MG/0.4ML ~~LOC~~ SOLN
40.0000 mg | SUBCUTANEOUS | Status: DC
  Administered 2020-02-12: 40 mg via SUBCUTANEOUS
  Filled 2020-02-11: qty 0.4

## 2020-02-11 MED ORDER — BUPIVACAINE-EPINEPHRINE (PF) 0.25% -1:200000 IJ SOLN
INTRAMUSCULAR | Status: AC
  Filled 2020-02-11: qty 30

## 2020-02-11 MED ORDER — OXYCODONE HCL 5 MG PO TABS
ORAL_TABLET | ORAL | Status: AC
  Administered 2020-02-11: 5 mg via ORAL
  Filled 2020-02-11: qty 1

## 2020-02-11 MED ORDER — ACETAMINOPHEN 500 MG PO TABS
ORAL_TABLET | ORAL | Status: AC
  Administered 2020-02-11: 1000 mg via ORAL
  Filled 2020-02-11: qty 2

## 2020-02-11 SURGICAL SUPPLY — 62 items
BLADE SURG SZ11 CARB STEEL (BLADE) ×3 IMPLANT
CANISTER SUCT 1200ML W/VALVE (MISCELLANEOUS) ×3 IMPLANT
CANNULA REDUC XI 12-8 STAPL (CANNULA) ×1
CANNULA REDUC XI 12-8MM STAPL (CANNULA) ×1
CANNULA REDUCER 12-8 DVNC XI (CANNULA) ×1 IMPLANT
CHLORAPREP W/TINT 26 (MISCELLANEOUS) ×3 IMPLANT
COVER TIP SHEARS 8 DVNC (MISCELLANEOUS) ×1 IMPLANT
COVER TIP SHEARS 8MM DA VINCI (MISCELLANEOUS) ×2
COVER WAND RF STERILE (DRAPES) IMPLANT
DEFOGGER SCOPE WARMER CLEARIFY (MISCELLANEOUS) ×3 IMPLANT
DERMABOND ADVANCED (GAUZE/BANDAGES/DRESSINGS) ×2
DERMABOND ADVANCED .7 DNX12 (GAUZE/BANDAGES/DRESSINGS) ×1 IMPLANT
DRAPE ARM DVNC X/XI (DISPOSABLE) ×4 IMPLANT
DRAPE COLUMN DVNC XI (DISPOSABLE) ×1 IMPLANT
DRAPE DA VINCI XI ARM (DISPOSABLE) ×8
DRAPE DA VINCI XI COLUMN (DISPOSABLE) ×2
ELECT CAUTERY BLADE 6.4 (BLADE) ×3 IMPLANT
ELECT REM PT RETURN 9FT ADLT (ELECTROSURGICAL) ×3
ELECTRODE REM PT RTRN 9FT ADLT (ELECTROSURGICAL) ×1 IMPLANT
GLOVE SURG SYN 7.0 (GLOVE) ×6 IMPLANT
GLOVE SURG SYN 7.5  E (GLOVE) ×4
GLOVE SURG SYN 7.5 E (GLOVE) ×2 IMPLANT
GOWN STRL REUS W/ TWL LRG LVL3 (GOWN DISPOSABLE) ×3 IMPLANT
GOWN STRL REUS W/TWL LRG LVL3 (GOWN DISPOSABLE) ×6
GRASPER SUT TROCAR 14GX15 (MISCELLANEOUS) ×3 IMPLANT
IRRIGATION STRYKERFLOW (MISCELLANEOUS) IMPLANT
IRRIGATOR STRYKERFLOW (MISCELLANEOUS)
IRRIGATOR SUCT 8 DISP DVNC XI (IRRIGATION / IRRIGATOR) ×1 IMPLANT
IRRIGATOR SUCTION 8MM XI DISP (IRRIGATION / IRRIGATOR) ×2
IV NS 1000ML (IV SOLUTION)
IV NS 1000ML BAXH (IV SOLUTION) IMPLANT
KIT PINK PAD W/HEAD ARE REST (MISCELLANEOUS) ×3
KIT PINK PAD W/HEAD ARM REST (MISCELLANEOUS) ×1 IMPLANT
LABEL OR SOLS (LABEL) ×3 IMPLANT
NEEDLE HYPO 22GX1.5 SAFETY (NEEDLE) ×3 IMPLANT
NEEDLE INSUFFLATION 14GA 120MM (NEEDLE) ×3 IMPLANT
OBTURATOR OPTICAL STANDARD 8MM (TROCAR) ×2
OBTURATOR OPTICAL STND 8 DVNC (TROCAR) ×1
OBTURATOR OPTICALSTD 8 DVNC (TROCAR) ×1 IMPLANT
PACK LAP CHOLECYSTECTOMY (MISCELLANEOUS) ×3 IMPLANT
PENCIL ELECTRO HAND CTR (MISCELLANEOUS) ×3 IMPLANT
SEAL CANN UNIV 5-8 DVNC XI (MISCELLANEOUS) ×2 IMPLANT
SEAL XI 5MM-8MM UNIVERSAL (MISCELLANEOUS) ×6
SEALER VESSEL DA VINCI XI (MISCELLANEOUS) ×2
SEALER VESSEL EXT DVNC XI (MISCELLANEOUS) ×1 IMPLANT
SET TUBE SMOKE EVAC HIGH FLOW (TUBING) ×3 IMPLANT
SOLUTION ELECTROLUBE (MISCELLANEOUS) ×3 IMPLANT
SPONGE LAP 18X18 RF (DISPOSABLE) ×3 IMPLANT
STAPLER CANNULA SEAL DVNC XI (STAPLE) ×1 IMPLANT
STAPLER CANNULA SEAL XI (STAPLE) ×2
SUT MNCRL 4-0 (SUTURE) ×2
SUT MNCRL 4-0 27XMFL (SUTURE) ×1
SUT STRATAFIX 0 PDS+ CT-2 23 (SUTURE) ×3
SUT STRATAFIX PDS 30 CT-1 (SUTURE) IMPLANT
SUT VIC AB 3-0 SH 27 (SUTURE) ×2
SUT VIC AB 3-0 SH 27X BRD (SUTURE) ×1 IMPLANT
SUT VICRYL 0 AB UR-6 (SUTURE) ×3 IMPLANT
SUT VLOC 90 2/L VL 12 GS22 (SUTURE) IMPLANT
SUTURE MNCRL 4-0 27XMF (SUTURE) ×1 IMPLANT
SUTURE STRATFX 0 PDS+ CT-2 23 (SUTURE) ×1 IMPLANT
TRAY FOLEY SLVR 16FR LF STAT (SET/KITS/TRAYS/PACK) ×3 IMPLANT
TROCAR 130MM GELPORT  DAV (MISCELLANEOUS) IMPLANT

## 2020-02-11 NOTE — H&P (Signed)
02/11/2020  Reason for Visit:  Incarcerated / strangulated ventral hernia  History of Present Illness: Taylor Copeland is a 44 y.o. female presenting for evaluation of abdominal pain.  Patient has a history of a laparoscopic appendectomy with Dr. Egbert Garibaldi in 2015.  She [place] was at the beach and when bending over, she felt pain in the umbilical area.  She has a known umbilical hernia, but the pain got worse and the bulging got worse.  She went to the local hospital and had a CT scan which she reports showed a fat containing umbilical hernia.  She then came back from the beach and went to [facility] to try to get established quicker for surgery.  [facility] ED requested another CT scan.  She had labwork which was otherwise normal WBC, normal lactic acid, but her CT scan showed signs concerning for strangulated fat containing umbilical hernia.  I am looking at the images personally and there's stranding and some fluid in the hernia sac.  It appears to contain omentum only and no intestine.  She was discharged home and given an appointment with surgery at [facility], but the appointment is not until 03/03/20.  She feels that she's in enough pain that she wanted to get in with a surgeon sooner and set up an appointment with [place].  She reports that she's needing the pain medication she was given at the hospitals and takes about 2-4 times per day.  She's tried Aleve but does not work as well as the narcotic pain medication.  She reports having some nausea and is using Zofran given.  Able to keep po intake and has bowel function.  Her pain is controlled with the medication and she's been avoiding any activity.  Past Medical History: Past Medical History:  Diagnosis Date  . Syncope      Past Surgical History: Past Surgical History:  Procedure Laterality Date  . APPENDECTOMY    . PLACEMENT OF BREAST IMPLANTS    . TUBAL LIGATION    . tummy tuck      Home Medications: Prior to Admission medications   Medication Sig Start Date  End Date Taking? Authorizing Provider  acebutolol (SECTRAL) 200 MG capsule Take by mouth. 02/05/17  Yes [provider]  albuterol (VENTOLIN HFA) 108 (90 Base) MCG/ACT inhaler Inhale into the lungs. 02/24/19  Yes [provider]  flecainide (TAMBOCOR) 100 MG tablet Take by mouth. 02/05/17  Yes [provider]  HYDROcodone-acetaminophen (NORCO/VICODIN) 5-325 MG tablet Take 1 tablet by mouth every 6 (six) hours as needed. 02/08/20  Yes [provider]  midodrine (PROAMATINE) 10 MG tablet Take by mouth. 02/05/17  Yes [provider]  ondansetron (ZOFRAN-ODT) 4 MG disintegrating tablet Take by mouth. 02/07/20 02/14/20 Yes [provider]  oxyCODONE-acetaminophen (PERCOCET/ROXICET) 5-325 MG tablet Take 1 tablet by mouth 4 (four) times daily as needed. 02/07/20  Yes [provider]  sertraline (ZOLOFT) 50 MG tablet One and one-half tablets daily 02/05/17  Yes [provider]    Allergies: Not on File  Social History:  reports that she has been smoking. She has never used smokeless tobacco. She reports current alcohol use. She reports current drug use. Drug: Marijuana.   Family History: Family History  Adopted: Yes    Review of Systems: Review of Systems  Constitutional: Negative for chills and fever.  HENT: Negative for hearing loss.   Respiratory: Negative for shortness of breath.   Cardiovascular: Negative for chest pain.  Gastrointestinal: Positive for abdominal  pain, nausea and vomiting. Negative for constipation and diarrhea.  Genitourinary: Negative for dysuria.  Musculoskeletal: Negative for myalgias.  Skin: Negative for rash.  Neurological: Negative for dizziness.  Psychiatric/Behavioral: Negative for depression.    Physical Exam Vitals:  Temp 98.1, BP 115/81, HR 61.  Height 5'4", Weight 172.6 lbs CONSTITUTIONAL: No acute distress HEENT:  Normocephalic, atraumatic, extraocular motion intact. NECK: Trachea is  midline, and there is no jugular venous distension.  RESPIRATORY:  Lungs are clear, and breath sounds are equal bilaterally. Normal respiratory effort without pathologic use of accessory muscles. CARDIOVASCULAR: Heart is regular without murmurs, gallops, or rubs. GI: The abdomen is soft, non-distended, with tenderness to palpation in the supraumbilical area, where there is an area of bulging, measuring about 8 cm wide.  Cannot fully palpate a hernia defect due to pain.  The hernia contents are not reducible.  The overlying skin is healthy without any erythema or induration or ulceration.  MUSCULOSKELETAL:  Normal muscle strength and tone in all four extremities.  No peripheral edema or cyanosis. SKIN: Skin turgor is normal. There are no pathologic skin lesions.  NEUROLOGIC:  Motor and sensation is grossly normal.  Cranial nerves are grossly intact. PSYCH:  Alert and oriented to person, place and time. Affect is normal.  Laboratory Analysis: Labs from [facility] 02/07/20: Na 138, K 3.8, Cl 105, CO2 27, BUN 14, Cr 0.63, LFTs within normal.  Lactic acid 0.8.  WBC 8.8, Hgb 13.5, Hct 40.4, Plt 282  Imaging: CT scan abdomen/pelvis at [facility] 02/07/20: IMPRESSION:  Large broad-based strangulated fat-containing ventral hernia. No bowel within the hernia sac.   The findings of this study were discussed via telephone with DR. Geoffery Lyons by Dr. Launa Flight on 02/07/2020 10:33 PM.   ADDENDUM:  -Prior appendectomy  -Common bile duct measures 7 mm (3:52) rather than the 13 mm preliminarily reported.   Assessment and Plan: This is a 44 y.o. female with abdominal pain with incarcerated vs strangulated ventral hernia.  --Discussed with the patient that I am unable to reduce her hernia.  She's not peritoneal or toxic at this moment, but discussed with her the option for admission directly to the hospital for hernia repair today vs tomorrow morning.  We could potentially do this in a semi-elective basis for mid next  week, but the patient prefers to be admitted.  I agree with that.   --Will admit to the surgery team, keep her NPO, start prophylactic IV abx, and schedule her for robotic ventral hernia repair late this afternoon as she had po intake at 9:30 am.  Discussed with her the risks of infection, bleeding, and injury to surrounding structures, the possibility for open surgery, the possibility of not being able to use mesh if there is concern for strangulation and compromised omentum.  She's in agreement and willing to proceed.   Howie Ill, MD [facility]

## 2020-02-11 NOTE — Brief Op Note (Signed)
02/11/2020  8:34 PM  PATIENT:  Taylor Copeland  44 y.o. female  PRE-OPERATIVE DIAGNOSIS:  Incarcerated Umbilical Hernia  POST-OPERATIVE DIAGNOSIS:  Incarcerated Umbilical Hernia  PROCEDURE:  Procedure(s): XI ROBOTIC ASSISTED VENTRAL HERNIA (N/A)  SURGEON:  Surgeon(s) and Role:    * Demecia Northway, MD - Primary  ANESTHESIA:   general  EBL:  30 mL   BLOOD ADMINISTERED:none  DRAINS: none   LOCAL MEDICATIONS USED:  BUPIVICAINE   SPECIMEN:  No Specimen  DISPOSITION OF SPECIMEN:  N/A  COUNTS:  YES  DICTATION: .Dragon Dictation  PLAN OF CARE: Admit to inpatient   PATIENT DISPOSITION:  PACU - hemodynamically stable.   Delay start of Pharmacological VTE agent (>24hrs) due to surgical blood loss or risk of bleeding: yes

## 2020-02-11 NOTE — Anesthesia Procedure Notes (Signed)
Procedure Name: Intubation Date/Time: 02/11/2020 5:46 PM Performed by: Irving Burton, CRNA Pre-anesthesia Checklist: Patient identified, Emergency Drugs available, Suction available and Patient being monitored Patient Re-evaluated:Patient Re-evaluated prior to induction Oxygen Delivery Method: Circle system utilized Preoxygenation: Pre-oxygenation with 100% oxygen Induction Type: IV induction Ventilation: Mask ventilation without difficulty Laryngoscope Size: McGraph and 3 Grade View: Grade I Tube type: Oral Tube size: 7.0 mm Number of attempts: 1 Airway Equipment and Method: Stylet and Video-laryngoscopy Placement Confirmation: ETT inserted through vocal cords under direct vision,  positive ETCO2 and breath sounds checked- equal and bilateral Secured at: 21 cm Tube secured with: Tape Dental Injury: Teeth and Oropharynx as per pre-operative assessment

## 2020-02-11 NOTE — Anesthesia Preprocedure Evaluation (Signed)
Anesthesia Evaluation  Patient identified by MRN, date of birth, ID band Patient awake    Reviewed: Allergy & Precautions, H&P , NPO status , Patient's Chart, lab work & pertinent test results, reviewed documented beta blocker date and time   History of Anesthesia Complications Negative for: history of anesthetic complications  Airway Mallampati: I  TM Distance: >3 FB Neck ROM: full    Dental  (+) Dental Advidsory Given, Teeth Intact   Pulmonary neg shortness of breath, asthma , neg COPD, neg recent URI, Current Smoker,    Pulmonary exam normal breath sounds clear to auscultation       Cardiovascular Exercise Tolerance: Good (-) hypertension(-) angina(-) Past MI and (-) Cardiac Stents Normal cardiovascular exam+ dysrhythmias (neurogenic syncope) (-) Valvular Problems/Murmurs Rhythm:regular Rate:Normal     Neuro/Psych negative neurological ROS  negative psych ROS   GI/Hepatic Neg liver ROS, GERD  ,  Endo/Other  negative endocrine ROS  Renal/GU negative Renal ROS  negative genitourinary   Musculoskeletal   Abdominal   Peds  Hematology negative hematology ROS (+)   Anesthesia Other Findings Past Medical History: No date: Syncope   Reproductive/Obstetrics negative OB ROS                             Anesthesia Physical Anesthesia Plan  ASA: II  Anesthesia Plan: General   Post-op Pain Management:    Induction: Intravenous  PONV Risk Score and Plan: 2 and Ondansetron, Dexamethasone, Midazolam and Promethazine  Airway Management Planned: Oral ETT  Additional Equipment:   Intra-op Plan:   Post-operative Plan: Extubation in OR  Informed Consent: I have reviewed the patients History and Physical, chart, labs and discussed the procedure including the risks, benefits and alternatives for the proposed anesthesia with the patient or authorized representative who has indicated his/her  understanding and acceptance.     Dental Advisory Given  Plan Discussed with: Anesthesiologist, CRNA and Surgeon  Anesthesia Plan Comments:         Anesthesia Quick Evaluation

## 2020-02-11 NOTE — Transfer of Care (Signed)
Immediate Anesthesia Transfer of Care Note  Patient: Taylor Copeland  Procedure(s) Performed: XI ROBOTIC ASSISTED VENTRAL HERNIA (N/A )  Patient Location: PACU  Anesthesia Type:General  Level of Consciousness: awake, alert  and oriented  Airway & Oxygen Therapy: Patient Spontanous Breathing  Post-op Assessment: Report given to RN and Post -op Vital signs reviewed and stable  Post vital signs: Reviewed and stable  Last Vitals:  Vitals Value Taken Time  BP 138/79 02/11/20 2028  Temp 36.7 C 02/11/20 2027  Pulse 79 02/11/20 2032  Resp 17 02/11/20 2031  SpO2 92 % 02/11/20 2032  Vitals shown include unvalidated device data.  Last Pain:  Vitals:   02/11/20 2027  PainSc: (P) Asleep         Complications: No complications documented.

## 2020-02-11 NOTE — Patient Instructions (Addendum)
Do not eat or drink anything.  Direct admit today for Umbilical Hernia repair for today or tomorrow.  Please wait at home until called to go to the hospital. They will call you on your cell phone number.  You will need to go to the [facility] Entrance and check in at Patient registration.     Hernia, Adult   A hernia happens when tissue inside your body pushes out through a weak spot in your belly muscles (abdominal wall). This makes a round lump (bulge). The lump may be:  In a scar from surgery that was done in your belly (incisional hernia).  Near your belly button (umbilical hernia).  In your groin (inguinal hernia). Your groin is the area where your leg meets your lower belly (abdomen). This kind of hernia could also be: ? In your scrotum, if you are female. ? In folds of skin around your vagina, if you are female.  In your upper thigh (femoral hernia).  Inside your belly (hiatal hernia). This happens when your stomach slides above the muscle between your belly and your chest (diaphragm). If your hernia is small and it does not cause pain, you may not need treatment. If your hernia is large or it causes pain, you may need surgery. Follow these instructions at home: Activity  Avoid stretching or overusing (straining) the muscles near your hernia. Straining can happen when you: ? Lift something heavy. ? Poop (have a bowel movement).  Do not lift anything that is heavier than 10 lb (4.5 kg), or the limit that you are told, until your doctor says that it is safe.  Use the strength of your legs when you lift something heavy. Do not use only your back muscles to lift. General instructions  Do these things if told by your doctor so you do not have trouble pooping (constipation): ? Drink enough fluid to keep your pee (urine) pale yellow. ? Eat foods that are high in fiber. These include fresh fruits and vegetables, whole grains, and beans. ? Limit foods that are high in fat and  processed sugars. These include foods that are fried or sweet. ? Take medicine for trouble pooping.  When you cough, try to cough gently.  You may try to push your hernia in by very gently pressing on it when you are lying down. Do not try to force the bulge back in if it will not push in easily.  If you are overweight, work with your doctor to lose weight safely.  Do not use any products that have nicotine or tobacco in them. These include cigarettes and e-cigarettes. If you need help quitting, ask your doctor.  If you will be having surgery (hernia repair), watch your hernia for changes in shape, size, or color. Tell your doctor if you see any changes.  Take over-the-counter and prescription medicines only as told by your doctor.  Keep all follow-up visits as told by your doctor. Contact a doctor if:  You get new pain, swelling, or redness near your hernia.  You poop fewer times in a week than normal.  You have trouble pooping.  You have poop (stool) that is more dry than normal.  You have poop that is harder or larger than normal. Get help right away if:  You have a fever.  You have belly pain that gets worse.  You feel sick to your stomach (nauseous).  You throw up (vomit).  Your hernia cannot be pushed in by very gently pressing  on it when you are lying down. Do not try to force the bulge back in if it will not push in easily.  Your hernia: ? Changes in shape or size. ? Changes color. ? Feels hard or it hurts when you touch it. These symptoms may represent a serious problem that is an emergency. Do not wait to see if the symptoms will go away. Get medical help right away. Call your local emergency services (911 in the [place] Summary  A hernia happens when tissue inside your body pushes out through a weak spot in the belly muscles. This creates a bulge.  If your hernia is small and it does not hurt, you may not need treatment. If your hernia is large or it hurts,  you may need surgery.  If you will be having surgery, watch your hernia for changes in shape, size, or color. Tell your doctor about any changes. This information is not intended to replace advice given to you by your health care provider. Make sure you discuss any questions you have with your health care provider. Document Revised: 11/05/2018 Document Reviewed: 04/16/2017 Elsevier Patient Education  2020 [employer].

## 2020-02-11 NOTE — Progress Notes (Signed)
02/11/2020  Reason for Visit:  Incarcerated / strangulated ventral hernia  History of Present Illness: Taylor Copeland is a 44 y.o. female presenting for evaluation of abdominal pain.  Patient has a history of a laparoscopic appendectomy with Dr. Egbert Garibaldi in 2015.  She recently was at the beach and when bending over, she felt pain in the umbilical area.  She has a known umbilical hernia, but the pain got worse and the bulging got worse.  She went to the local hospital and had a CT scan which she reports showed a fat containing umbilical hernia.  She then came back from the beach and went to [facility] to try to get established quicker for surgery.  [facility] ED requested another CT scan.  She had labwork which was otherwise normal WBC, normal lactic acid, but her CT scan showed signs concerning for strangulated fat containing umbilical hernia.  I am looking at the images personally and there's stranding and some fluid in the hernia sac.  It appears to contain omentum only and no intestine.  She was discharged home and given an appointment with surgery at [facility], but the appointment is not until 03/03/20.  She feels that she's in enough pain that she wanted to get in with a surgeon sooner and set up an appointment with [place].  She reports that she's needing the pain medication she was given at the hospitals and takes about 2-4 times per day.  She's tried Aleve but does not work as well as the narcotic pain medication.  She reports having some nausea and is using Zofran given.  Able to keep po intake and has bowel function.  Her pain is controlled with the medication and she's been avoiding any activity.  Past Medical History: Past Medical History:  Diagnosis Date  . Syncope      Past Surgical History: Past Surgical History:  Procedure Laterality Date  . APPENDECTOMY    . PLACEMENT OF BREAST IMPLANTS    . TUBAL LIGATION    . tummy tuck      Home Medications: Prior to Admission medications   Medication Sig Start Date  End Date Taking? Authorizing Provider  acebutolol (SECTRAL) 200 MG capsule Take by mouth. 02/05/17  Yes [provider]  albuterol (VENTOLIN HFA) 108 (90 Base) MCG/ACT inhaler Inhale into the lungs. 02/24/19  Yes [provider]  flecainide (TAMBOCOR) 100 MG tablet Take by mouth. 02/05/17  Yes [provider]  HYDROcodone-acetaminophen (NORCO/VICODIN) 5-325 MG tablet Take 1 tablet by mouth every 6 (six) hours as needed. 02/08/20  Yes [provider]  midodrine (PROAMATINE) 10 MG tablet Take by mouth. 02/05/17  Yes [provider]  ondansetron (ZOFRAN-ODT) 4 MG disintegrating tablet Take by mouth. 02/07/20 02/14/20 Yes [provider]  oxyCODONE-acetaminophen (PERCOCET/ROXICET) 5-325 MG tablet Take 1 tablet by mouth 4 (four) times daily as needed. 02/07/20  Yes [provider]  sertraline (ZOLOFT) 50 MG tablet One and one-half tablets daily 02/05/17  Yes [provider]    Allergies: Not on File  Social History:  reports that she has been smoking. She has never used smokeless tobacco. She reports current alcohol use. She reports current drug use. Drug: Marijuana.   Family History: Family History  Adopted: Yes    Review of Systems: Review of Systems  Constitutional: Negative for chills and fever.  HENT: Negative for hearing loss.   Respiratory: Negative for shortness of breath.   Cardiovascular: Negative for chest pain.  Gastrointestinal: Positive for abdominal  pain, nausea and vomiting. Negative for constipation and diarrhea.  Genitourinary: Negative for dysuria.  Musculoskeletal: Negative for myalgias.  Skin: Negative for rash.  Neurological: Negative for dizziness.  Psychiatric/Behavioral: Negative for depression.    Physical Exam Vitals:  Temp 98.1, BP 115/81, HR 61.  Height 5'4", Weight 172.6 lbs CONSTITUTIONAL: No acute distress HEENT:  Normocephalic, atraumatic, extraocular motion intact. NECK: Trachea is  midline, and there is no jugular venous distension.  RESPIRATORY:  Lungs are clear, and breath sounds are equal bilaterally. Normal respiratory effort without pathologic use of accessory muscles. CARDIOVASCULAR: Heart is regular without murmurs, gallops, or rubs. GI: The abdomen is soft, non-distended, with tenderness to palpation in the supraumbilical area, where there is an area of bulging, measuring about 8 cm wide.  Cannot fully palpate a hernia defect due to pain.  The hernia contents are not reducible.  The overlying skin is healthy without any erythema or induration or ulceration.  MUSCULOSKELETAL:  Normal muscle strength and tone in all four extremities.  No peripheral edema or cyanosis. SKIN: Skin turgor is normal. There are no pathologic skin lesions.  NEUROLOGIC:  Motor and sensation is grossly normal.  Cranial nerves are grossly intact. PSYCH:  Alert and oriented to person, place and time. Affect is normal.  Laboratory Analysis: Labs from [facility] 02/07/20: Na 138, K 3.8, Cl 105, CO2 27, BUN 14, Cr 0.63, LFTs within normal.  Lactic acid 0.8.  WBC 8.8, Hgb 13.5, Hct 40.4, Plt 282  Imaging: CT scan abdomen/pelvis at [facility] 02/07/20: IMPRESSION:  Large broad-based strangulated fat-containing ventral hernia. No bowel within the hernia sac.   The findings of this study were discussed via telephone with DR. Geoffery Lyons by Dr. Launa Flight on 02/07/2020 10:33 PM.   ADDENDUM:  -Prior appendectomy  -Common bile duct measures 7 mm (3:52) rather than the 13 mm preliminarily reported.   Assessment and Plan: This is a 44 y.o. female with abdominal pain with incarcerated vs strangulated ventral hernia.  --Discussed with the patient that I am unable to reduce her hernia.  She's not peritoneal or toxic at this moment, but discussed with her the option for admission directly to the hospital for hernia repair today vs tomorrow morning.  We could potentially do this in a semi-elective basis for mid next  week, but the patient prefers to be admitted.  I agree with that.   --Will admit to the surgery team, keep her NPO, start prophylactic IV abx, and schedule her for robotic ventral hernia repair late this afternoon as she had po intake at 9:30 am.  Discussed with her the risks of infection, bleeding, and injury to surrounding structures, the possibility for open surgery, the possibility of not being able to use mesh if there is concern for strangulation and compromised omentum.  She's in agreement and willing to proceed.  Face-to-face time spent with the patient and care providers was 45 minutes, with more than 50% of the time spent counseling, educating, and coordinating care of the patient.     Howie Ill, MD [facility]

## 2020-02-11 NOTE — Op Note (Addendum)
  Procedure Date:  02/11/2020  Pre-operative Diagnosis:  Incarcerated Incisional hernia  Post-operative Diagnosis:  Incarcerated incisional hernia  Procedure:  Robotic Assisted Incarcerated Incisional Hernia Repair  Surgeon:  Howie Ill, MD  Anesthesia:  General endotracheal  Estimated Blood Loss:  30 ml  Specimens:  None  Complications:  None  Findings:  Patient had a significant portion of her omentum herniating through an umbilical defect.  There was also serosanguinous fluid inside the hernia sac.  No necrotic tissue.  Indications for Procedure:  This is a 45 y.o. female who presents with an incarcerated incisional ventral hernia.  The options of surgery versus observation were reviewed with the patient and/or family. The risks of bleeding, abscess or infection, recurrence of symptoms, potential for an open procedure, injury to surrounding structures, and chronic pain were all discussed with the patient and was willing to proceed.  Description of Procedure: The patient was correctly identified in the preoperative area and brought into the operating room.  The patient was placed supine with VTE prophylaxis in place.  Appropriate time-outs were performed.  Anesthesia was induced and the patient was intubated.  Appropriate antibiotics were infused.  The abdomen was prepped and draped in a sterile fashion.  A Veress needle was introduced in the left upper quadrant and pneumoperitoneum was obtained with appropriate pressures.  A left lateral 8 mm port was placed using optivue technique without complications.  A 12 mm port was placed in LUQ under direct visualization as well as an 8 mm port in the LLQ.  The DaVinci platform was docked, the camera was targeted, and instruments were placed under direct visualization.    Upon initial inspection, a significant portion of her omentum was herniating through a supraumbilical defect.  After thorough dissection and manipulation, the full  omentum was reduced.  It was noted to be inflamed and there was also serosanguinous fluid that drained from within the hernia sac.  With all the manipulation, there was some bleeding from omental vessels.  This was controlled with cautery and with the vessel sealer.  The hernia defect was then closed using 0 Stratafix suture.  Given the inflammation, serosanguinous fluid, and condition of the omentum, it was decided not to place a mesh as a precaution due to the potential for mesh infection.  After the defect was closed, the abdomen was inspected again and there was no bowel injury.  The serosanguinous drainage from the hernia was suctioned, and any serosanguinous fluid was suctioned out as well.  Local anesthetic was infused in all incisions as well as around the hernia closure. The 12-mm port was removed and the fascia was closed under direct visualization utilizing an Endo Close technique with 0 Vicryl interrupted suture.  The 8 mm ports were removed.  The incisions were closed with 3-0 Vicryl and 4-0 Monocryl for the 12 mm port site, and 4-0 Monocryl for the rest.  The wounds were cleaned and sealed with DermaBond.  The patient was emerged from anesthesia and extubated and brought to the recovery room for further management.  The patient tolerated the procedure well and all counts were correct at the end of the case.   Howie Ill, MD

## 2020-02-12 DIAGNOSIS — K439 Ventral hernia without obstruction or gangrene: Secondary | ICD-10-CM | POA: Diagnosis not present

## 2020-02-12 LAB — BASIC METABOLIC PANEL
Anion gap: 7 (ref 5–15)
BUN: 8 mg/dL (ref 6–20)
CO2: 25 mmol/L (ref 22–32)
Calcium: 8.7 mg/dL — ABNORMAL LOW (ref 8.9–10.3)
Chloride: 104 mmol/L (ref 98–111)
Creatinine, Ser: 0.74 mg/dL (ref 0.44–1.00)
GFR calc Af Amer: >60 mL/min (ref >60)
GFR calc non Af Amer: >60 mL/min (ref >60)
Glucose, Bld: 158 mg/dL — ABNORMAL HIGH (ref 70–99)
Potassium: 4.1 mmol/L (ref 3.5–5.1)
Sodium: 136 mmol/L (ref 135–145)

## 2020-02-12 LAB — CBC
HCT: 36.2 % (ref 36.0–46.0)
Hemoglobin: 12.5 g/dL (ref 12.0–15.0)
MCH: 31.6 pg (ref 26.0–34.0)
MCHC: 34.5 g/dL (ref 30.0–36.0)
MCV: 91.4 fL (ref 80.0–100.0)
Platelets: 203 10*3/uL (ref 150–400)
RBC: 3.96 MIL/uL (ref 3.87–5.11)
RDW: 12.3 % (ref 11.5–15.5)
WBC: 7.6 10*3/uL (ref 4.0–10.5)
nRBC: 0 % (ref 0.0–0.2)

## 2020-02-12 LAB — MAGNESIUM: Magnesium: 1.8 mg/dL (ref 1.7–2.4)

## 2020-02-12 MED ORDER — IBUPROFEN 600 MG PO TABS: 600.0000 mg | ORAL_TABLET | Freq: Three times a day (TID) | ORAL | 1 refills | Status: DC | PRN

## 2020-02-12 MED ORDER — OXYCODONE HCL 5 MG PO TABS
5.0000 mg | ORAL_TABLET | ORAL | 0 refills | Status: DC | PRN
Start: 2020-02-12 — End: 2020-03-01

## 2020-02-12 NOTE — Discharge Instructions (Signed)
Laparoscopic Ventral Hernia Repair, Care After This sheet gives you information about how to care for yourself after your procedure. Your health care provider may also give you more specific instructions. If you have problems or questions, contact your health care provider. What can I expect after the procedure? After the procedure, it is common to have:  Pain, discomfort, or soreness. Follow these instructions at home: Incision care   Follow instructions from your health care provider about how to take care of your incision. Make sure you: ? Wash your hands with soap and water before you change your bandage (dressing) or before you touch your abdomen. If soap and water are not available, use hand sanitizer. ? Change your dressing as told by your health care provider. ? Leave stitches (sutures), skin glue, or adhesive strips in place. These skin closures may need to stay in place for 2 weeks or longer. If adhesive strip edges start to loosen and curl up, you may trim the loose edges. Do not remove adhesive strips completely unless your health care provider tells you to do that.  Check your incision area every day for signs of infection. Check for: ? Redness, swelling, or pain. ? Fluid or blood. ? Warmth. ? Pus or a bad smell. Bathing   Do not take baths, swim, or use a hot tub until your health care provider approves. Ask your health care provider if you can take showers. You may only be allowed to take sponge baths for bathing.  Keep your bandage (dressing) dry until your health care provider says it can be removed. Activity  Do not lift anything that is heavier than 10 lb (4.5 kg) until your health care provider approves.  Do not drive or use heavy machinery while taking prescription pain medicine. Ask your health care provider when it is safe for you to drive or use heavy machinery.  Do not drive for 24 hours if you were given a medicine to help you relax (sedative) during your  procedure.  Rest as told by your health care provider. You may return to your normal activities when your health care provider approves. General instructions  Take over-the-counter and prescription medicines only as told by your health care provider.  To prevent or treat constipation while you are taking prescription pain medicine, your health care provider may recommend that you: ? Take over-the-counter or prescription medicines. ? Eat foods that are high in fiber, such as fresh fruits and vegetables, whole grains, and beans. ? Limit foods that are high in fat and processed sugars, such as fried and sweet foods.  Drink enough fluid to keep your urine clear or pale yellow.  Hold a pillow over your abdomen when you cough or sneeze. This helps with pain.  Keep all follow-up visits as told by your health care provider. This is important. Contact a health care provider if:  You have: ? A fever or chills. ? Redness, swelling, or pain around your incision. ? Fluid or blood coming from your incision. ? Pus or a bad smell coming from your incision. ? Pain that gets worse or does not get better with medicine. ? Nausea or vomiting. ? A cough. ? Shortness of breath.  Your incision feels warm to the touch.  You have not had a bowel movement in three days.  You are not able to urinate. Get help right away if:  You have severe pain in your abdomen.  You have persistent nausea and vomiting.  You have  redness, warmth, or pain in your leg.  You have chest pain.  You have trouble breathing. Summary  After this procedure, it is common to have pain, discomfort, or soreness.  Follow instructions from your health care provider about how to take care of your incision.  Check your incision area every day for signs of infection. Report any signs of infection to your health care provider.  Keep all follow-up visits as told by your health care provider. This is important. This information  is not intended to replace advice given to you by your health care provider. Make sure you discuss any questions you have with your health care provider. Document Revised: 06/27/2017 Document Reviewed: 03/06/2016 Elsevier Patient Education  2020 [employer].

## 2020-02-12 NOTE — Progress Notes (Signed)
Taylor Copeland to be D/C'd home per MD order.  Discussed prescriptions and follow up appointments with the patient. Prescriptions given to patient, medication list explained in detail. Pt verbalized understanding.  Allergies as of 02/12/2020   Not on File      Medication List     STOP taking these medications    HYDROcodone-acetaminophen 5-325 MG tablet Commonly known as: NORCO/VICODIN   ondansetron 4 MG disintegrating tablet Commonly known as: ZOFRAN-ODT   oxyCODONE-acetaminophen 5-325 MG tablet Commonly known as: PERCOCET/ROXICET       TAKE these medications    acebutolol 200 MG capsule Commonly known as: SECTRAL Take by mouth.   albuterol 108 (90 Base) MCG/ACT inhaler Commonly known as: VENTOLIN HFA Inhale into the lungs.   flecainide 100 MG tablet Commonly known as: TAMBOCOR Take by mouth.   ibuprofen 600 MG tablet Commonly known as: ADVIL Take 1 tablet (600 mg total) by mouth every 8 (eight) hours as needed for mild pain or moderate pain.   midodrine 10 MG tablet Commonly known as: PROAMATINE Take by mouth.   oxyCODONE 5 MG immediate release tablet Commonly known as: Oxy IR/ROXICODONE Take 1 tablet (5 mg total) by mouth every 4 (four) hours as needed for severe pain.   sertraline 50 MG tablet Commonly known as: ZOLOFT One and one-half tablets daily               Discharge Care Instructions  (From admission, onward)           Start     Ordered   02/12/20 0000  No dressing needed        02/12/20 1107            Vitals:   02/12/20 0431 02/12/20 1129  BP: 116/73 116/71  Pulse: 90 82  Resp: 17   Temp: 97.7 F (36.5 C) (!) 97.3 F (36.3 C)  SpO2: 98% 100%    Skin clean, dry and intact without evidence of skin break down, no evidence of skin tears noted. IV catheter discontinued intact. Site without signs and symptoms of complications. Dressing and pressure applied. Pt denies pain at this time. No complaints noted.  An After  Visit Summary was printed and given to the patient. Patient escorted via WC, and D/C home via private auto.  Taylor Copeland Taylor Copeland

## 2020-02-12 NOTE — Discharge Summary (Signed)
Patient ID: Taylor Copeland MRN: [id number] DOB/AGE: 10/15/1975 44 y.o.  Admit date: 02/11/2020 Discharge date: 02/12/2020   Discharge Diagnoses:  Active Problems:   Incarcerated ventral hernia   Procedures: Robotic assisted incarcerated ventral hernia repair  Hospital Course: Patient was admitted on 7/16 directly from the office due to incarcerated/strangulated incisional hernia containing omentum.  Patient was taken to the operating room same day underwent a successful robotic hernia repair.  No mesh was used due to significant inflammation and fluid in the hernia sac.  Postoperatively patient did well her diet was slowly advanced.  Pain was well controlled.  Incisions were clean dry and intact.  Abdomen soft, nondistended, appropriately tender to palpation.  Patient will be discharged home today.  Consults: None  Disposition: Discharge disposition: 01-Home or Self Care       Discharge Instructions    Call MD for:  difficulty breathing, headache or visual disturbances   Complete by: As directed    Call MD for:  persistant nausea and vomiting   Complete by: As directed    Call MD for:  redness, tenderness, or signs of infection (pain, swelling, redness, odor or green/yellow discharge around incision site)   Complete by: As directed    Call MD for:  severe uncontrolled pain   Complete by: As directed    Call MD for:  temperature >100.4   Complete by: As directed    Diet - low sodium heart healthy   Complete by: As directed    Discharge instructions   Complete by: As directed    1.  Patient may shower, but do not scrub wounds heavily and dab dry only. 2.  Do not submerge wounds in pool/tub for 1 week. 3.  Do not apply ointments or hydrogen peroxide to the wounds.   Driving Restrictions   Complete by: As directed    Do not drive while taking narcotics for pain control.   Increase activity slowly   Complete by: As directed    Lifting restrictions   Complete by: As directed     No heavy lifting or pushing of more than 10-15 lbs for 4 weeks.   No dressing needed   Complete by: As directed      Allergies as of 02/12/2020   Not on File     Medication List    STOP taking these medications   HYDROcodone-acetaminophen 5-325 MG tablet Commonly known as: NORCO/VICODIN   ondansetron 4 MG disintegrating tablet Commonly known as: ZOFRAN-ODT   oxyCODONE-acetaminophen 5-325 MG tablet Commonly known as: PERCOCET/ROXICET     TAKE these medications   acebutolol 200 MG capsule Commonly known as: SECTRAL Take by mouth.   albuterol 108 (90 Base) MCG/ACT inhaler Commonly known as: VENTOLIN HFA Inhale into the lungs.   flecainide 100 MG tablet Commonly known as: TAMBOCOR Take by mouth.   ibuprofen 600 MG tablet Commonly known as: ADVIL Take 1 tablet (600 mg total) by mouth every 8 (eight) hours as needed for mild pain or moderate pain.   midodrine 10 MG tablet Commonly known as: PROAMATINE Take by mouth.   oxyCODONE 5 MG immediate release tablet Commonly known as: Oxy IR/ROXICODONE Take 1 tablet (5 mg total) by mouth every 4 (four) hours as needed for severe pain.   sertraline 50 MG tablet Commonly known as: ZOLOFT One and one-half tablets daily            Discharge Care Instructions  (From admission, onward)  Start     Ordered   02/12/20 0000  No dressing needed        02/12/20 1107          Follow-up Information    Gjon Letarte, Elita Quick, MD Follow up in 2 week(s).   Specialty: General Surgery Contact information: [address] [place] [address] [phone]

## 2020-02-13 NOTE — Anesthesia Postprocedure Evaluation (Signed)
Anesthesia Post Note  Patient: Taylor Copeland  Procedure(s) Performed: XI ROBOTIC ASSISTED VENTRAL HERNIA (N/A )  Patient location during evaluation: PACU Anesthesia Type: General Level of consciousness: awake and alert Pain management: pain level controlled Vital Signs Assessment: post-procedure vital signs reviewed and stable Respiratory status: spontaneous breathing, nonlabored ventilation, respiratory function stable and patient connected to nasal cannula oxygen Cardiovascular status: blood pressure returned to baseline and stable Postop Assessment: no apparent nausea or vomiting Anesthetic complications: no   No complications documented.   Last Vitals:  Vitals:   02/12/20 0431 02/12/20 1129  BP: 116/73 116/71  Pulse: 90 82  Resp: 17   Temp: 36.5 C (!) 36.3 C  SpO2: 98% 100%    Last Pain:  Vitals:   02/12/20 1129  TempSrc: Oral  PainSc:                  Lenard Simmer

## 2020-02-14 ENCOUNTER — Encounter: Payer: Self-pay | Admitting: Surgery

## 2020-02-23 DIAGNOSIS — K458 Other specified abdominal hernia without obstruction or gangrene: Secondary | ICD-10-CM | POA: Insufficient documentation

## 2020-03-01 ENCOUNTER — Ambulatory Visit (INDEPENDENT_AMBULATORY_CARE_PROVIDER_SITE_OTHER): Payer: Self-pay | Admitting: Surgery

## 2020-03-01 ENCOUNTER — Encounter: Payer: Self-pay | Admitting: Surgery

## 2020-03-01 ENCOUNTER — Other Ambulatory Visit: Payer: Self-pay

## 2020-03-01 VITALS — BP 127/83 | HR 70 | Temp 98.3°F | Resp 12 | Ht 64.0 in | Wt 168.0 lb

## 2020-03-01 DIAGNOSIS — K430 Incisional hernia with obstruction, without gangrene: Secondary | ICD-10-CM

## 2020-03-01 DIAGNOSIS — Z09 Encounter for follow-up examination after completed treatment for conditions other than malignant neoplasm: Secondary | ICD-10-CM

## 2020-03-01 NOTE — Progress Notes (Signed)
03/01/2020  HPI: Taylor Copeland is a 44 y.o. female s/p robotic assisted incarcerated incisional hernia repair on 02/11/20.  She had an outpatient CT scan at [facility] ED on 02/07/20 showing concerns for strangulated hernia containing omentum.  Her labs were overall normal and she was discharged home instead.  She was seen my by on 7/16 in the office and she was admitted same day for surgery.  Her omentum was incarcerated, with ascitic fluid, but overall no necrotic tissue.  However, due to the ascites, it was decided not to place mesh as a precaution, and her defect was closed primarily.    Today, she reports that she's been improving overall.  She still has some discomfort in the midline where her hernia was located, and the LUQ where the biggest incision is.  This is aggravated when she coughs or sneezes, or if she twists in certain way.  Denies any nausea, is having normal bowel function, denies any fevers.  Vital signs: BP 127/83   Pulse 70   Temp 98.3 F (36.8 C) (Oral)   Resp 12   Ht 5\' 4"  (1.626 m)   Wt 168 lb (76.2 kg)   LMP  (LMP Unknown) Comment: upreg 7/16 negative  SpO2 97%   BMI 28.84 kg/m    Physical Exam: Constitutional:  No acute distress Abdomen:  Soft, non-distended, appropriately sore to palpation.  No evidence of recurrent hernia at this point and the midline area is soft, without erythema, without swelling.  The left lateral incisions are healing well, without any evidence of infection.  Assessment/Plan: This is a 44 y.o. female s/p robotic assisted incarcerated incisional hernia repair.  --Discussed with the patient that her soreness/pain symptoms are not uncommon given the surgery, where the sutures are located, and the inflammation that she had prior to surgery plus what is added with the surgery.  It is also reassuring that she's not needing narcotics for pain and that she continues to improve.  These symptoms will continue to improve with time. --Discussed again the  reasoning for not placing mesh during the hernia repair.  She is at risk of hernia recurrence given that the tissue was inflamed and there's no mesh.  If this does happen, she knows to let 53 know sooner rather than later. --She can continue taking Ibuprofen and Tylenol for pain as needed.  She may also wear an abdominal binder that she has at home. --Follow up with [place] prn.   [place], MD [facility]

## 2020-03-01 NOTE — Patient Instructions (Signed)

## 2020-04-19 ENCOUNTER — Other Ambulatory Visit: Payer: Self-pay

## 2020-04-19 ENCOUNTER — Ambulatory Visit (INDEPENDENT_AMBULATORY_CARE_PROVIDER_SITE_OTHER): Payer: Medicare Other | Admitting: Surgery

## 2020-04-19 ENCOUNTER — Encounter: Payer: Self-pay | Admitting: Surgery

## 2020-04-19 VITALS — BP 117/69 | HR 65 | Temp 98.4°F | Resp 12 | Wt 166.8 lb

## 2020-04-19 DIAGNOSIS — Z8719 Personal history of other diseases of the digestive system: Secondary | ICD-10-CM

## 2020-04-19 DIAGNOSIS — Z9889 Other specified postprocedural states: Secondary | ICD-10-CM

## 2020-04-19 NOTE — Patient Instructions (Addendum)
You may begin taking Prilosec to help with acid reflux.  [facility] Ul[facility] at 8[facility]45[facility]. Arrival 830am. Nothing to eat or drink after midnight.   We will call you with results and discuss further instructions. Call the office if you have any questions or concerns.

## 2020-04-19 NOTE — Progress Notes (Signed)
04/19/2020  HPI: Taylor Copeland is a 44 y.o. female s/p robotic assisted incisional hernia repair on 7/16 for strangulated hernia containing omentum.  There was ascitic fluid in the hernia sac, and given the inflammation, no mesh was placed and only primary repair was done.  She presents today because recently she's been having some more abdominal pain in the location of the hernia.  The pain at the robotic incisions is gone now but she describes pain in the upper abdominal area at the hernia site and epigastric region.  She has been more active with her grandchildren, but unsure if it's just increased activity level that's causing this.  She's also had some episodes of nausea and reflux.  She's not sure if the pain and nausea happen at the same time but she thinks they're different times.  Not related to eating spicy foods and has not started new diets.  Vital signs: BP 117/69   Pulse 65   Temp 98.4 F (36.9 C)   Resp 12   Wt 166 lb 12.8 oz (75.7 kg)   SpO2 98%   BMI 28.63 kg/m    Physical Exam: Constitutional:  No acute distress Abdomen:  Soft, non-distended, with tenderness to palpation over the hernia site.  The site is soft, with firmness towards the deeper portion, without any palpable hernia defect or changes with straining/coughing.  The left sided robotic incisions are well healed.  Assessment/Plan: This is a 44 y.o. female s/p robotic assisted strangulated incisional hernia repair.  --Discussed with the patient that I'm unable to feel a hernia defect today.  There is firmness at the prior hernia site consistent with scar tissue.  However, given her history, will order abdominal ultrasound to evaluate the hernia site for recurrence and as a precaution, her RUQ for gallbladder pathology.  In the meantime, recommended that she give a trial of Prilosec OTC to see if this could be related to GERD, though less likely.  Based on U/S findings, will call with results or schedule further  imaging / follow up.   Howie Ill, MD  [facility]

## 2020-04-26 ENCOUNTER — Other Ambulatory Visit: Payer: Self-pay

## 2020-04-26 ENCOUNTER — Ambulatory Visit
Admission: RE | Admit: 2020-04-26 | Discharge: 2020-04-26 | Disposition: A | Discharge status: Home / Self Care | Payer: Medicare Other | Source: Ambulatory Visit | Attending: Surgery | Admitting: Surgery

## 2020-04-26 DIAGNOSIS — Z8719 Personal history of other diseases of the digestive system: Secondary | ICD-10-CM | POA: Diagnosis present

## 2020-04-26 DIAGNOSIS — Z9889 Other specified postprocedural states: Secondary | ICD-10-CM | POA: Diagnosis present

## 2020-04-27 ENCOUNTER — Telehealth: Payer: Self-pay

## 2020-04-27 NOTE — Telephone Encounter (Signed)
Patient notified of ultrasound results, per Dr Everlene Farrier U/S shows some fluid around the hernia repair that is not uncommon after surgery. It should get better with time but may take up to several weeks. Patient is aware and will call back with any further questions.

## 2020-11-02 IMAGING — US US ABDOMEN COMPLETE
1 series · 13 of 25 positions shown · non-contrast
Comparison: CT 08/01/2013

CLINICAL DATA: Abdomen pain status post ventral hernia repair

EXAM:
ABDOMEN ULTRASOUND COMPLETE

[Series 1: us abdomen complete · 0.20mm/px · 13 of 84 slices shown]
[im 1/84]
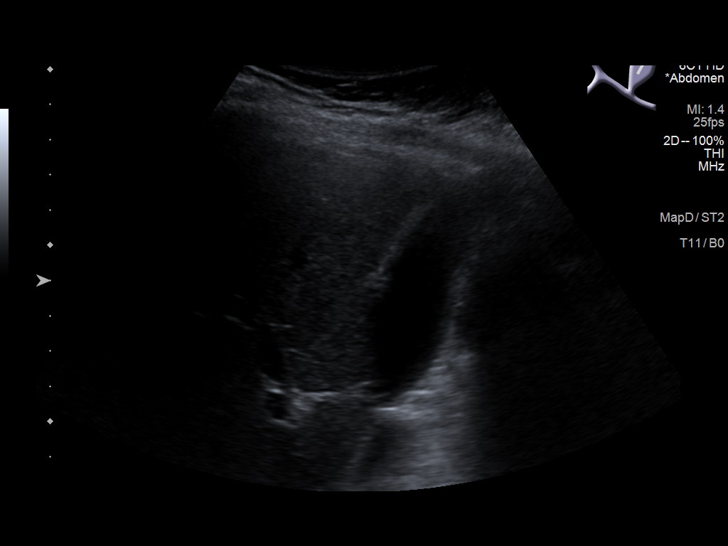
[im 7/84]
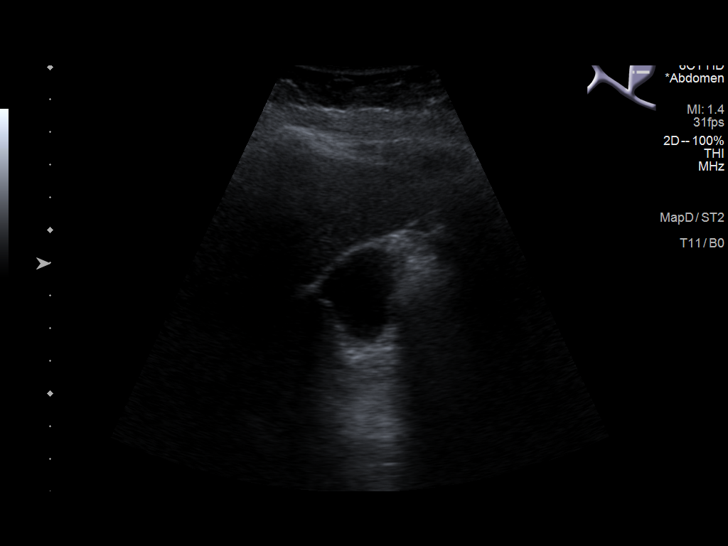
[im 14/84]
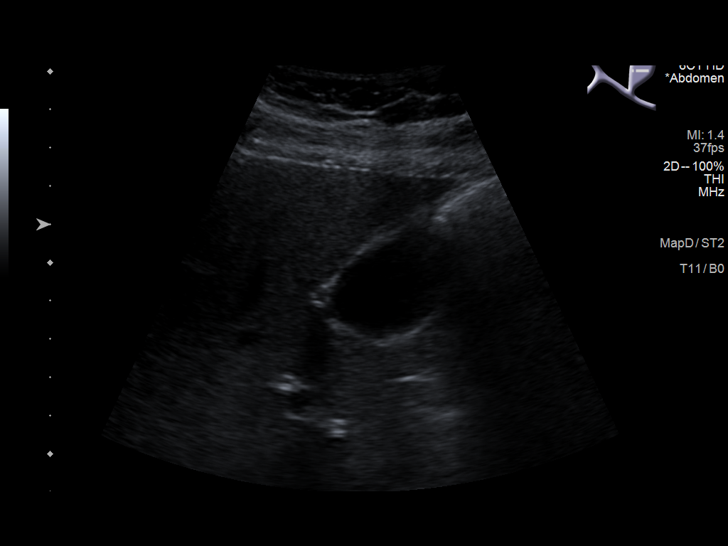
[im 21/84]
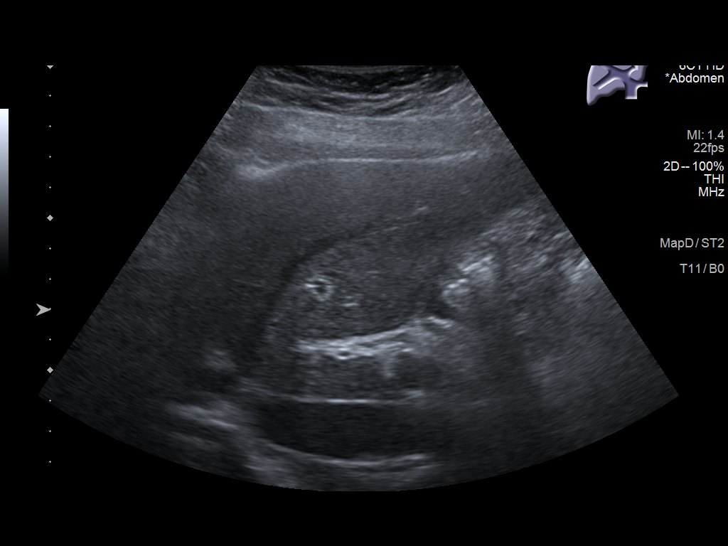
[im 28/84]
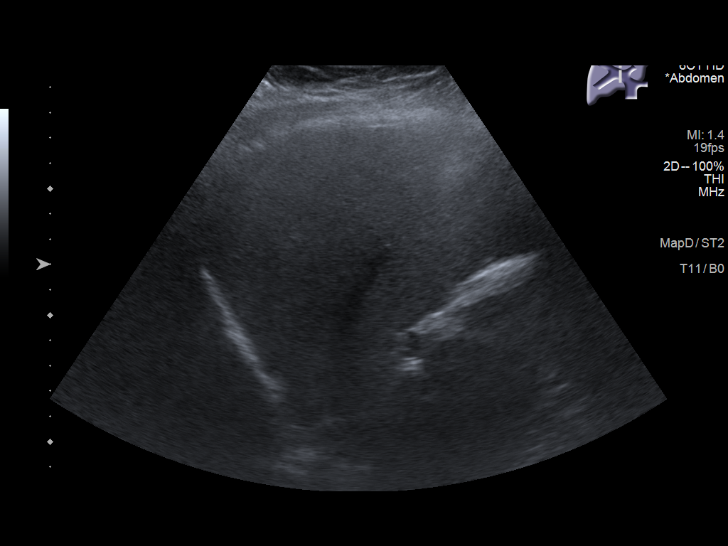
[im 35/84]
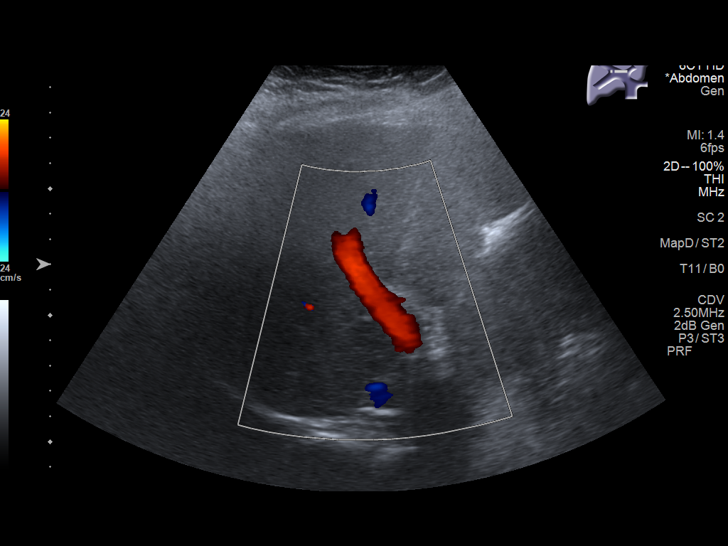
[im 42/84]
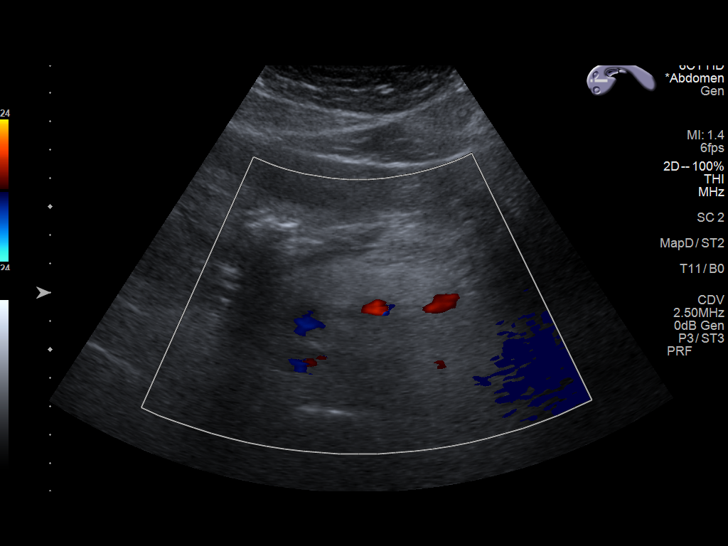
[im 49/84]
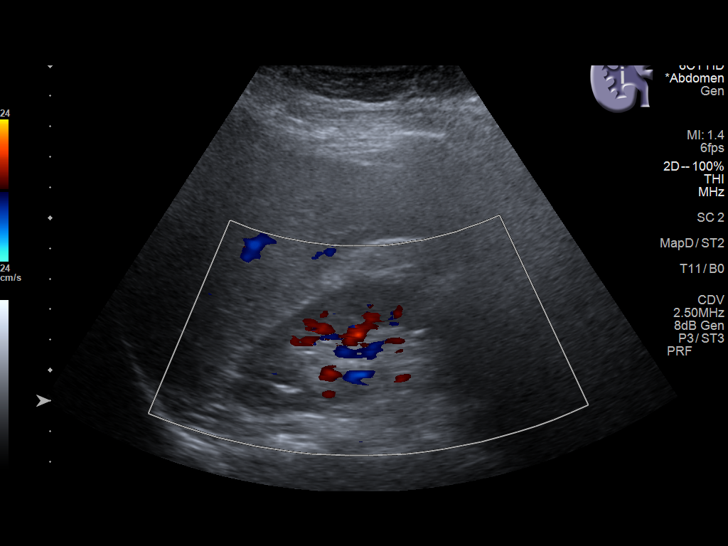
[im 56/84]
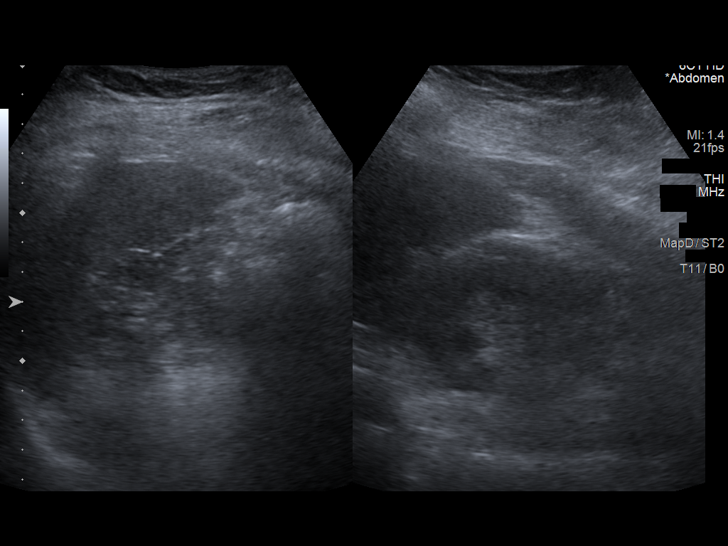
[im 63/84]
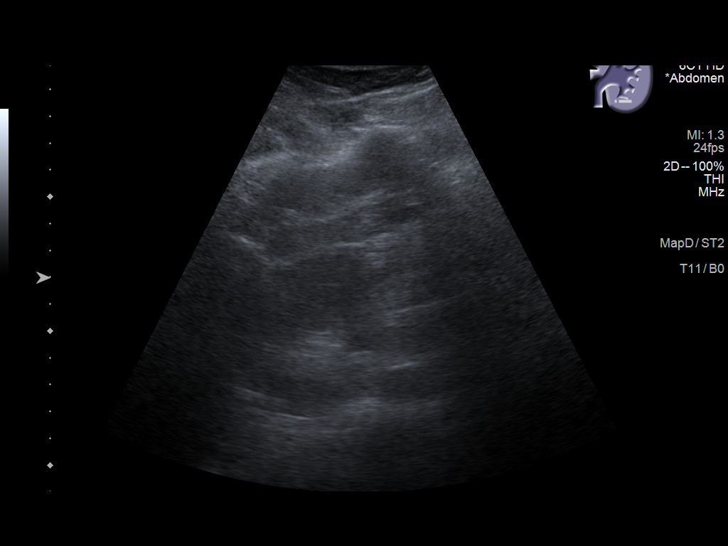
[im 70/84]
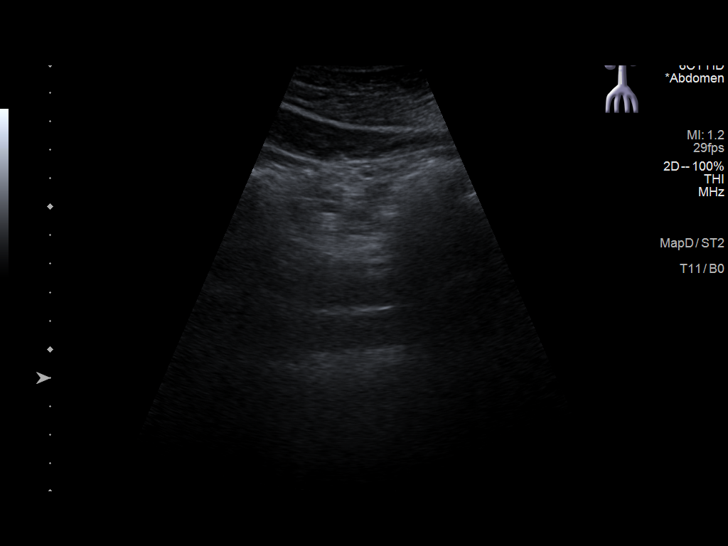
[im 77/84]
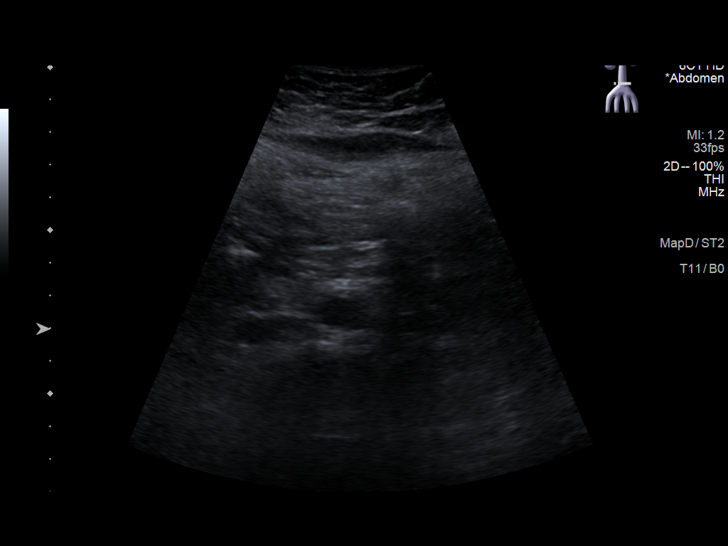
[im 84/84]
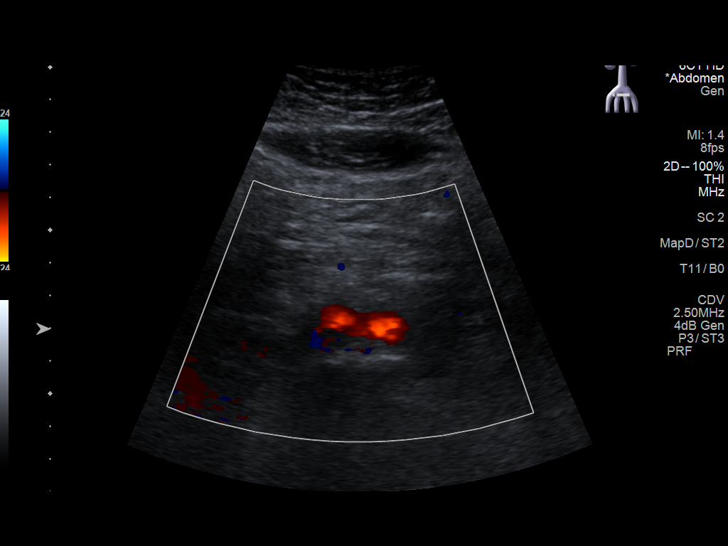

[13 of 25 positions shown; findings below may reference images not displayed]

FINDINGS: Gallbladder: No gallstones or wall thickening visualized. No
sonographic Murphy sign noted by sonographer.

Common bile duct: Diameter: 3 mm

Liver: No focal lesion identified. Within normal limits in
parenchymal echogenicity. Portal vein is patent on color Doppler
imaging with normal direction of blood flow towards the liver.

IVC: No abnormality visualized.

Pancreas: Visualized portion unremarkable.

Spleen: Size and appearance within normal limits.

Right Kidney: Length: 9.6 cm. Echogenicity within normal limits. No
mass or hydronephrosis visualized.

Left Kidney: Length: 10.4 cm. Echogenicity within normal limits. No
mass or hydronephrosis visualized.

Abdominal aorta: No aneurysm visualized.

Other findings: Mildly complex fluid collection within the midline
superficial soft tissues above the level of the umbilicus measuring
8.7 x 7.1 x 1.8 cm
IMPRESSION: 1. 8.7 cm mildly complex fluid collection within the soft tissues of
the supraumbilical region presumably representing postoperative
collection. Correlate for any signs or symptoms of infection in the
region.
2. Otherwise negative examination

## 2020-12-11 ENCOUNTER — Telehealth: Payer: Self-pay | Admitting: Surgery

## 2020-12-11 NOTE — Telephone Encounter (Signed)
Spoke with the patient and she said that her pain and bloating symptoms never really went away since surgery. She now says she is sometimes getting some nausea and occasional vomiting. She will come in this Friday to be seen. She is aware to call back if symptoms get much worse.

## 2020-12-11 NOTE — Telephone Encounter (Signed)
Patient is calling with some concern patient is having some Nausea, and vomiting, having some pain , pain level at a 3 also feeling bloated, stomach feels hard patient had a ventral hernia repair 02/11/20 Dr Aleen Campi patient is asking if one of the nurses would give her a call back. Please call patient and advise.

## 2020-12-15 ENCOUNTER — Encounter: Payer: Self-pay | Admitting: Surgery

## 2020-12-15 ENCOUNTER — Other Ambulatory Visit: Payer: Self-pay

## 2020-12-15 ENCOUNTER — Ambulatory Visit (INDEPENDENT_AMBULATORY_CARE_PROVIDER_SITE_OTHER): Payer: Medicare Other | Admitting: Surgery

## 2020-12-15 ENCOUNTER — Telehealth: Payer: Self-pay

## 2020-12-15 VITALS — BP 146/80 | HR 89 | Temp 98.4°F | Ht 64.0 in | Wt 181.0 lb

## 2020-12-15 DIAGNOSIS — R1084 Generalized abdominal pain: Secondary | ICD-10-CM

## 2020-12-15 DIAGNOSIS — Z9889 Other specified postprocedural states: Secondary | ICD-10-CM | POA: Diagnosis not present

## 2020-12-15 DIAGNOSIS — Z8719 Personal history of other diseases of the digestive system: Secondary | ICD-10-CM | POA: Diagnosis not present

## 2020-12-15 NOTE — Telephone Encounter (Signed)
Patient notified of Ct scan scheduled on 12/29/20 at 8:30 am. 8:15 am arrival, NPO for 4 hours prior, and she will pick up a prep kit.

## 2020-12-15 NOTE — Progress Notes (Signed)
12/15/2020  History of Present Illness: Taylor Copeland is a 45 y.o. female s/p robotic assisted incarcerated incisional hernia repair on 02/11/20.  She had incarcerated omentum in the hernia sac and ascitic fluid in the sac as well.  Due to that, no mesh was used in the repair and only primarily with sutures.  She presents today because over the past two months, she's been feeling more pressure and discomfort in the mid abdomen in the area of hernia repair.  She feels more bloated and fuller as well.  She has had issues with nausea and she also had emesis when she bent over to pick up laundry.  Denies any fevers, chills, chest pain, shortness of breath, constipation or diarrhea.  Does not feel anything bulging in her mid abdomen, but she reports that last time she didn't feel any bulging either and it was more the significant pain that sent her to the ER.    Past Medical History: Past Medical History:  Diagnosis Date  . Syncope   . Syncope      Past Surgical History: Past Surgical History:  Procedure Laterality Date  . LAPAROSCOPIC APPENDECTOMY  08/01/2013  . PLACEMENT OF BREAST IMPLANTS    . TUBAL LIGATION    . tummy tuck    . XI ROBOTIC ASSISTED VENTRAL HERNIA N/A 02/11/2020   incarcerated omentum in umbilical incisional hernia    Home Medications: Prior to Admission medications   Medication Sig Start Date End Date Taking? Authorizing Provider  ondansetron (ZOFRAN) 4 MG tablet Take 4 mg by mouth every 8 (eight) hours as needed for nausea or vomiting.   Yes [provider]  acebutolol (SECTRAL) 200 MG capsule Take by mouth. 02/05/17   [provider]  albuterol (VENTOLIN HFA) 108 (90 Base) MCG/ACT inhaler Inhale into the lungs. 02/24/19   [provider]  flecainide (TAMBOCOR) 100 MG tablet Take by mouth. 02/05/17   [provider]  ibuprofen (ADVIL) 600 MG tablet Take 1 tablet (600 mg total) by mouth every 8 (eight) hours as needed for mild pain or  moderate pain. 02/12/20   Henrene Dodge, MD  midodrine (PROAMATINE) 10 MG tablet Take by mouth. 02/05/17   [provider]  sertraline (ZOLOFT) 50 MG tablet One and one-half tablets daily 02/05/17   [provider]    Allergies: No Known Allergies  Review of Systems: Review of Systems  Constitutional: Negative for chills and fever.  Respiratory: Negative for shortness of breath.   Cardiovascular: Negative for chest pain.  Gastrointestinal: Positive for abdominal pain, nausea and vomiting. Negative for constipation and diarrhea.  Genitourinary: Negative for dysuria.  Musculoskeletal: Negative for myalgias.  Skin: Negative for rash.  Neurological: Negative for dizziness.  Psychiatric/Behavioral: Negative for depression.    Physical Exam BP (!) 146/80   Pulse 89   Temp 98.4 F (36.9 C)   Ht 5\' 4"  (1.626 m)   Wt 181 lb (82.1 kg)   SpO2 97%   BMI 31.07 kg/m  CONSTITUTIONAL: No acute distress, well nourished. HEENT:  Normocephalic, atraumatic, extraocular motion intact. RESPIRATORY:  Lungs are clear, and breath sounds are equal bilaterally. Normal respiratory effort without pathologic use of accessory muscles. CARDIOVASCULAR: Heart is regular without murmurs, gallops, or rubs. GI: The abdomen is soft, non-distended, with some tenderness with deep palpation at the midline in the epigastric area.  There is palpable hernia defect and no bulging palpable when asked the patient to strain or cough.  Prior left sided incisions are  well healed.  No right upper quadrant tenderness and negative Murphy's sign.  NEUROLOGIC:  Motor and sensation is grossly normal.  Cranial nerves are grossly intact. PSYCH:  Alert and oriented to person, place and time. Affect is normal.  Labs/Imaging: None recently  Assessment and Plan: This is a 45 y.o. female s/p robotic assisted incisional hernia repair on 02/11/20.  --Discussed with the patient again that mesh was not used during her  hernia repair due to the incarcerated omentum with ascitic fluid in the hernia sac.  There was high concern for infection risk of mesh was used then.  She could potentially be developing a new hernia in the area now, although her exam is less conclusive for that.  She is having bloatedness and epigastric pressure, which could also potentially be related to biliary pathology, though her symptoms are not associated with po intake.  Given her surgery last year, will proceed with CT scan of abdomen/pelvis with oral contrast (no IV contrast due to national shortage) to better evaluate. --She'll follow up with me after CT scan is done.  If the results are negative, then we may proceed with U/S of RUQ.  Recommended for now that as a precaution, try to maintain a low fat diet.  She understands and all of her questions have been answered.  Face-to-face time spent with the patient and care providers was 25 minutes, with more than 50% of the time spent counseling, educating, and coordinating care of the patient.     Howie Ill, MD [facility]

## 2020-12-15 NOTE — Patient Instructions (Signed)
We will get you scheduled for a CT of the abdomen and pelvis with oral contrast. We will call you about this appointment. We will have you follow up here to go over the next steps after your CT scan results are back.

## 2020-12-29 ENCOUNTER — Ambulatory Visit
Admission: RE | Admit: 2020-12-29 | Discharge: 2020-12-29 | Disposition: A | Discharge status: Home / Self Care | Payer: Medicare Other | Source: Ambulatory Visit | Attending: Surgery | Admitting: Surgery

## 2020-12-29 ENCOUNTER — Other Ambulatory Visit: Payer: Self-pay

## 2020-12-29 DIAGNOSIS — R1084 Generalized abdominal pain: Secondary | ICD-10-CM | POA: Diagnosis not present

## 2020-12-29 DIAGNOSIS — Z9889 Other specified postprocedural states: Secondary | ICD-10-CM | POA: Insufficient documentation

## 2020-12-29 DIAGNOSIS — Z8719 Personal history of other diseases of the digestive system: Secondary | ICD-10-CM | POA: Diagnosis present

## 2020-12-29 NOTE — Progress Notes (Signed)
CT scan personally viewed.  No evidence of ventral hernia.  Discussed with patient the results.  Will get HIDA scan ordered to evaluate her gallbladder function.  Had prior U/S in 2021 which did not show any cholelithiasis, and there was none seen on CT scan from today.  Henrene Dodge, MD

## 2021-01-01 ENCOUNTER — Telehealth: Payer: Self-pay

## 2021-01-01 DIAGNOSIS — R1084 Generalized abdominal pain: Secondary | ICD-10-CM

## 2021-01-01 NOTE — Telephone Encounter (Signed)
HIDA scan scheduled 01/10/2021 @ 7:30 [facility] entrance. No thing to eat /drink after midnight. Do not take any pain medications.  Patient notified.

## 2021-01-08 ENCOUNTER — Ambulatory Visit: Payer: Medicare Other | Admitting: Surgery

## 2021-01-09 ENCOUNTER — Encounter: Payer: Self-pay | Admitting: Radiology

## 2021-01-10 ENCOUNTER — Other Ambulatory Visit: Payer: Self-pay

## 2021-01-10 ENCOUNTER — Ambulatory Visit
Admission: RE | Admit: 2021-01-10 | Discharge: 2021-01-10 | Disposition: A | Discharge status: Home / Self Care | Payer: Medicare Other | Source: Ambulatory Visit | Attending: Surgery | Admitting: Surgery

## 2021-01-10 DIAGNOSIS — R1084 Generalized abdominal pain: Secondary | ICD-10-CM | POA: Diagnosis not present

## 2021-01-10 MED ORDER — TECHNETIUM TC 99M MEBROFENIN IV KIT
5.0000 | PACK | Freq: Once | INTRAVENOUS | Status: AC | PRN
  Administered 2021-01-10: 5.2 via INTRAVENOUS

## 2021-01-10 NOTE — Progress Notes (Signed)
01/10/21  HIDA scan images viewed personally and reviewed.  Her EF is 6%.  Called patient to inform of results.  She reports multiple bouts of emesis during study after drinking the Ensure.  She had had a previous HIDA scan about 20 years ago with normal EF, but today is clearly a difference and supports her symptoms.  Patient has a follow up appointment with me on 6/17 to discuss cholecystectomy and schedule surgery.  Henrene Dodge, MD

## 2021-01-12 ENCOUNTER — Ambulatory Visit (INDEPENDENT_AMBULATORY_CARE_PROVIDER_SITE_OTHER): Payer: Medicare Other | Admitting: Surgery

## 2021-01-12 ENCOUNTER — Telehealth: Payer: Self-pay | Admitting: Surgery

## 2021-01-12 ENCOUNTER — Encounter: Payer: Self-pay | Admitting: Surgery

## 2021-01-12 ENCOUNTER — Other Ambulatory Visit: Payer: Self-pay

## 2021-01-12 VITALS — BP 111/78 | HR 70 | Temp 98.6°F | Ht 64.0 in | Wt 178.8 lb

## 2021-01-12 DIAGNOSIS — K828 Other specified diseases of gallbladder: Secondary | ICD-10-CM | POA: Diagnosis not present

## 2021-01-12 NOTE — Telephone Encounter (Signed)
Patient has been advised of Pre-Admission date/time, COVID Testing date and Surgery date.  Surgery Date: 01/17/21 Preadmission Testing Date: 01/16/21 (phone 8a-1p) Covid Testing Date: Not needed.    Patient has been made aware to call [phone], between 1-3:00pm the day before surgery, to find out what time to arrive for surgery.

## 2021-01-12 NOTE — H&P (View-Only) (Signed)
01/12/2021  History of Present Illness: Taylor Copeland is a 45 y.o. female s/p robotic assisted incisional hernia repair for incarcerated omentum on 02/11/20.  She's been having ongoing abdominal discomfort in the epigastric area, associated with nausea, vomiting, and bloatedness.  We did a CT scan of abdomen/pelvis on 12/29/20 which did not show any abnormalities with her prior hernia repair and no gallstones.  As a precaution, we then ordered a HIDA scan to further evaluate her gallbladder.  She had a previous one in 2000 which did not show any issues.  However, on the study done on 01/10/21, her EF was 6%, and she had multiple bouts of emesis after drinking the Ensure.  She presents today to discuss her results again and to schedule for cholecystectomy.  Reports still the same symptoms, without worsening issues.  Denies any fevers, chills, chest pain, shortness of breath.    Past Medical History: Past Medical History:  Diagnosis Date   Syncope    Syncope      Past Surgical History: Past Surgical History:  Procedure Laterality Date   LAPAROSCOPIC APPENDECTOMY  08/01/2013   PLACEMENT OF BREAST IMPLANTS     TUBAL LIGATION     tummy tuck     XI ROBOTIC ASSISTED VENTRAL HERNIA N/A 02/11/2020   incarcerated omentum in umbilical incisional hernia    Home Medications: Prior to Admission medications   Medication Sig Start Date End Date Taking? Authorizing Provider  acebutolol (SECTRAL) 200 MG capsule Take by mouth. 02/05/17   [provider]  albuterol (VENTOLIN HFA) 108 (90 Base) MCG/ACT inhaler Inhale into the lungs. 02/24/19   [provider]  flecainide (TAMBOCOR) 100 MG tablet Take by mouth. 02/05/17   [provider]  ibuprofen (ADVIL) 600 MG tablet Take 1 tablet (600 mg total) by mouth every 8 (eight) hours as needed for mild pain or moderate pain. 02/12/20   Henrene Dodge, MD  midodrine (PROAMATINE) 10 MG tablet Take by mouth. 02/05/17   [provider]   ondansetron (ZOFRAN) 4 MG tablet Take 4 mg by mouth every 8 (eight) hours as needed for nausea or vomiting.    [provider]  sertraline (ZOLOFT) 50 MG tablet One and one-half tablets daily 02/05/17   [provider]    Allergies: No Known Allergies  Review of Systems: Review of Systems  Constitutional:  Negative for chills and fever.  Respiratory:  Negative for shortness of breath.   Cardiovascular:  Negative for chest pain.  Gastrointestinal:  Positive for abdominal pain, nausea and vomiting. Negative for constipation and diarrhea.  Genitourinary:  Negative for dysuria.  Skin:  Negative for rash.   Physical Exam BP 111/78   Pulse 70   Temp 98.6 F (37 C) (Oral)   Ht 5\' 4"  (1.626 m)   Wt 178 lb 12.8 oz (81.1 kg)   SpO2 96%   BMI 30.69 kg/m  CONSTITUTIONAL: No acute distress, well nourished. HEENT:  Normocephalic, atraumatic, extraocular motion intact. RESPIRATORY:  Lungs are clear, and breath sounds are equal bilaterally. Normal respiratory effort without pathologic use of accessory muscles. CARDIOVASCULAR: Heart is regular without murmurs, gallops, or rubs. GI: The abdomen is soft, non-distended, with mild discomfort in the epigastric and RUQ area on palpation.  Negative Murphy's sign. No hernia recurrence on exam. NEUROLOGIC:  Motor and sensation is grossly normal.  Cranial nerves are grossly intact. PSYCH:  Alert and oriented to person, place and time. Affect is normal.  Labs/Imaging: CT scan abdomen/pelvis 12/29/20: IMPRESSION:  1. No recurrent hernia.  No acute intra-abdominal process.  HIDA scan 01/10/21: IMPRESSION: 1. Reduced gallbladder ejection fraction likely reflecting biliary dyskinesia/chronic cholecystitis. 2.  Patent cystic and common bile ducts.  Assessment and Plan: This is a 45 y.o. female with abdominal pain and workup revealing biliary dyskinesia.  --The patient feels very relieved that we have come to find the most likely source  of her ongoing problems.  We discussed further the results of her initial CAT scan on 6/3 and a subsequent HIDA scan on 6/15 and discussed also with her the meaning of these findings.  She is very interested and motivated to proceed with cholecystectomy.  Discussed with her the plan for robotic assisted cholecystectomy, and reviewed with her the surgery at length, including risks of bleeding, infection, and injury to surrounding structures, as well as this being same-day surgery, and post-op activity restrictions.  She's willing to proceed. --Will schedule her for Wednesday 01/17/21 in AM.  Face-to-face time spent with the patient and care providers was 40 minutes, with more than 50% of the time spent counseling, educating, and coordinating care of the patient.     Howie Ill, MD [facility]

## 2021-01-12 NOTE — Progress Notes (Signed)
01/12/2021  History of Present Illness: Taylor Copeland is a 45 y.o. female s/p robotic assisted incisional hernia repair for incarcerated omentum on 02/11/20.  She's been having ongoing abdominal discomfort in the epigastric area, associated with nausea, vomiting, and bloatedness.  We did a CT scan of abdomen/pelvis on 12/29/20 which did not show any abnormalities with her prior hernia repair and no gallstones.  As a precaution, we then ordered a HIDA scan to further evaluate her gallbladder.  She had a previous one in 2000 which did not show any issues.  However, on the study done on 01/10/21, her EF was 6%, and she had multiple bouts of emesis after drinking the Ensure.  She presents today to discuss her results again and to schedule for cholecystectomy.  Reports still the same symptoms, without worsening issues.  Denies any fevers, chills, chest pain, shortness of breath.    Past Medical History: Past Medical History:  Diagnosis Date   Syncope    Syncope      Past Surgical History: Past Surgical History:  Procedure Laterality Date   LAPAROSCOPIC APPENDECTOMY  08/01/2013   PLACEMENT OF BREAST IMPLANTS     TUBAL LIGATION     tummy tuck     XI ROBOTIC ASSISTED VENTRAL HERNIA N/A 02/11/2020   incarcerated omentum in umbilical incisional hernia    Home Medications: Prior to Admission medications   Medication Sig Start Date End Date Taking? Authorizing Provider  acebutolol (SECTRAL) 200 MG capsule Take by mouth. 02/05/17   [provider]  albuterol (VENTOLIN HFA) 108 (90 Base) MCG/ACT inhaler Inhale into the lungs. 02/24/19   [provider]  flecainide (TAMBOCOR) 100 MG tablet Take by mouth. 02/05/17   [provider]  ibuprofen (ADVIL) 600 MG tablet Take 1 tablet (600 mg total) by mouth every 8 (eight) hours as needed for mild pain or moderate pain. 02/12/20   Henrene Dodge, MD  midodrine (PROAMATINE) 10 MG tablet Take by mouth. 02/05/17   [provider]   ondansetron (ZOFRAN) 4 MG tablet Take 4 mg by mouth every 8 (eight) hours as needed for nausea or vomiting.    [provider]  sertraline (ZOLOFT) 50 MG tablet One and one-half tablets daily 02/05/17   [provider]    Allergies: No Known Allergies  Review of Systems: Review of Systems  Constitutional:  Negative for chills and fever.  Respiratory:  Negative for shortness of breath.   Cardiovascular:  Negative for chest pain.  Gastrointestinal:  Positive for abdominal pain, nausea and vomiting. Negative for constipation and diarrhea.  Genitourinary:  Negative for dysuria.  Skin:  Negative for rash.   Physical Exam BP 111/78   Pulse 70   Temp 98.6 F (37 C) (Oral)   Ht 5\' 4"  (1.626 m)   Wt 178 lb 12.8 oz (81.1 kg)   SpO2 96%   BMI 30.69 kg/m  CONSTITUTIONAL: No acute distress, well nourished. HEENT:  Normocephalic, atraumatic, extraocular motion intact. RESPIRATORY:  Lungs are clear, and breath sounds are equal bilaterally. Normal respiratory effort without pathologic use of accessory muscles. CARDIOVASCULAR: Heart is regular without murmurs, gallops, or rubs. GI: The abdomen is soft, non-distended, with mild discomfort in the epigastric and RUQ area on palpation.  Negative Murphy's sign. No hernia recurrence on exam. NEUROLOGIC:  Motor and sensation is grossly normal.  Cranial nerves are grossly intact. PSYCH:  Alert and oriented to person, place and time. Affect is normal.  Labs/Imaging: CT scan abdomen/pelvis 12/29/20: IMPRESSION:  1. No recurrent hernia.  No acute intra-abdominal process.  HIDA scan 01/10/21: IMPRESSION: 1. Reduced gallbladder ejection fraction likely reflecting biliary dyskinesia/chronic cholecystitis. 2.  Patent cystic and common bile ducts.  Assessment and Plan: This is a 45 y.o. female with abdominal pain and workup revealing biliary dyskinesia.  --The patient feels very relieved that we have come to find the most likely source  of her ongoing problems.  We discussed further the results of her initial CAT scan on 6/3 and a subsequent HIDA scan on 6/15 and discussed also with her the meaning of these findings.  She is very interested and motivated to proceed with cholecystectomy.  Discussed with her the plan for robotic assisted cholecystectomy, and reviewed with her the surgery at length, including risks of bleeding, infection, and injury to surrounding structures, as well as this being same-day surgery, and post-op activity restrictions.  She's willing to proceed. --Will schedule her for Wednesday 01/17/21 in AM.  Face-to-face time spent with the patient and care providers was 40 minutes, with more than 50% of the time spent counseling, educating, and coordinating care of the patient.     Berthold Glace Luis Tod Abrahamsen, MD [facility]     

## 2021-01-12 NOTE — Patient Instructions (Addendum)
Our surgery scheduler will call within 24-48 hours to schedule your surgery. Please have the Blue surgery sheet available when speaking with her.    Minimally Invasive Cholecystectomy, Care After This sheet gives you information about how to care for yourself after your procedure. Your doctor may also give you more specific instructions. If youhave problems or questions, contact your doctor. What can I expect after the procedure? After the procedure, it is common: To have pain at the areas of surgery. You will be given medicines for pain. To vomit or feel like you may vomit. To feel fullness in the belly (bloating) or to have pain in the shoulder. This comes from the gas that was used during the surgery. Follow these instructions at home: Medicines Take over-the-counter and prescription medicines only as told by your doctor. If you were prescribed an antibiotic medicine, take it as told by your doctor. Do not stop using the antibiotic even if you start to feel better. Ask your doctor if the medicine prescribed to you: Requires you to avoid driving or using machinery. Can cause trouble pooping (constipation). You may need to take these actions to prevent or treat trouble pooping: Drink enough fluid to keep your pee (urine) pale yellow. Take over-the-counter or prescription medicines. Eat foods that are high in fiber. These include beans, whole grains, and fresh fruits and vegetables. Limit foods that are high in fat and sugar. These include fried or sweet foods. Incision care  Follow instructions from your doctor about how to take care of your cuts from surgery (incisions). Make sure you: Wash your hands with soap and water for at least 20 seconds before and after you change your bandage (dressing). If you cannot use soap and water, use hand sanitizer. Change your bandage as told by your doctor. Leave stitches (sutures), skin glue, or skin tape (adhesive) strips in place. They may need to  stay in place for 2 weeks or longer. If tape strips get loose and curl up, you may trim the loose edges. Do not remove tape strips completely unless your doctor says it is okay. Do not take baths, swim, or use a hot tub until your doctor approves. Ask your doctor if you may take showers. You may only be allowed to take sponge baths. Check your surgery area every day for signs of infection. Check for: More redness, swelling, or pain. Fluid or blood. Warmth. Pus or a bad smell.  Activity Rest as told by your doctor. Do not sit for a long time without moving. Get up to take short walks every 1-2 hours. This is important. Ask for help if you feel weak or unsteady. Do not lift anything that is heavier than 10 lb (4.5 kg), or the limit that you are told, until your doctor says that it is safe. Do not play contact sports until your doctor says it is okay. Do not return to work or school until your doctor says it is okay. Return to your normal activities as told by your doctor. Ask your doctor what activities are safe for you. General instructions If you were given a medicine to help you relax (sedative) during your procedure, it can affect you for many hours. Do not drive or use machinery until your doctor says that it is safe. Keep all follow-up visits as told by your doctor. This is important. Contact a doctor if: You get a rash. You have more redness, swelling, or pain around your cuts from surgery. You have fluid  or blood coming from your cuts from surgery. Your cuts from surgery feel warm to the touch. You have pus or a bad smell coming from your cuts from surgery. You have a fever. One or more of your cuts from surgery breaks open. Get help right away if: You have trouble breathing. You have chest pain. You have pain that is getting worse in your shoulders. You faint or feel dizzy when you stand. You have very bad pain in your belly (abdomen). You feel like you may vomit or you vomit,  and this lasts for more than one day. You have leg pain. Summary After your surgery, it is common to have pain at the areas of surgery. You may also have vomiting or fullness in the belly. Follow your doctor's instructions about medicine, activity restrictions, and caring for your surgery areas. Do not do activities that require a lot of effort. Contact a doctor if you have a fever or other signs of infection, such as more redness, swelling, or pain around the cuts from surgery. Get help right away if you have chest pain, increasing pain in the shoulders, or trouble breathing. This information is not intended to replace advice given to you by your health care provider. Make sure you discuss any questions you have with your healthcare provider. Document Revised: 06/29/2019 Document Reviewed: 06/29/2019 Elsevier Patient Education  2022 [employer].

## 2021-01-16 ENCOUNTER — Encounter
Admission: RE | Admit: 2021-01-16 | Discharge: 2021-01-16 | Disposition: A | Discharge status: Home / Self Care | Payer: Medicare Other | Source: Ambulatory Visit | Attending: Surgery | Admitting: Surgery

## 2021-01-16 ENCOUNTER — Other Ambulatory Visit: Payer: Self-pay

## 2021-01-16 HISTORY — DX: Unspecified asthma, uncomplicated: J45.909

## 2021-01-16 MED ORDER — ACETAMINOPHEN 500 MG PO TABS: 1000.0000 mg | ORAL_TABLET | ORAL | Status: AC

## 2021-01-16 MED ORDER — CEFAZOLIN SODIUM-DEXTROSE 2-4 GM/100ML-% IV SOLN
2.0000 g | INTRAVENOUS | Status: AC
  Administered 2021-01-17: 2 g via INTRAVENOUS

## 2021-01-16 MED ORDER — CHLORHEXIDINE GLUCONATE CLOTH 2 % EX PADS: 6.0000 | MEDICATED_PAD | Freq: Once | CUTANEOUS | Status: DC

## 2021-01-16 MED ORDER — CHLORHEXIDINE GLUCONATE 0.12 % MT SOLN: 15.0000 mL | Freq: Once | OROMUCOSAL | Status: DC

## 2021-01-16 MED ORDER — INDOCYANINE GREEN 25 MG IV SOLR
2.5000 mg | INTRAVENOUS | Status: AC
Start: 2021-01-16 — End: 2021-01-17
  Administered 2021-01-17: 2.5 mg via INTRAVENOUS
  Filled 2021-01-16: qty 1

## 2021-01-16 MED ORDER — GABAPENTIN 300 MG PO CAPS: 300.0000 mg | ORAL_CAPSULE | ORAL | Status: AC

## 2021-01-16 MED ORDER — LACTATED RINGERS IV SOLN: INTRAVENOUS | Status: DC

## 2021-01-16 MED ORDER — ORAL CARE MOUTH RINSE: 15.0000 mL | Freq: Once | OROMUCOSAL | Status: DC

## 2021-01-16 MED ORDER — FAMOTIDINE 20 MG PO TABS: 20.0000 mg | ORAL_TABLET | Freq: Once | ORAL | Status: DC

## 2021-01-16 NOTE — Patient Instructions (Signed)
Your procedure is scheduled UX:[id number] Report to Day Surgery.  Go to registration desk in the medical mall first To find out your arrival time please call [phone] between 1PM - 3PM on  today.  Remember: Instructions that are not followed completely may result in serious medical risk,  up to and including death, or upon the discretion of your surgeon and anesthesiologist your  surgery may need to be rescheduled.     _X__ 1. Do not eat food after midnight the night before your procedure.                 No chewing gum or hard candies. You may drink clear liquids up to 2 hours                 before you are scheduled to arrive for your surgery- DO not drink clear                 liquids within 2 hours of the start of your surgery.                 Clear Liquids include:  water, apple juice without pulp, clear Gatorade, G2 or                  Gatorade Zero (avoid Red/Purple/Blue), Black Coffee or Tea (Do not add                 anything to coffee or tea). _____2.   Complete the "Ensure Clear Pre-surgery Clear Carbohydrate Drink" provided to you, 2 hours before arrival. **If you       are diabetic you will be provided with an alternative drink, Gatorade Zero or G2.  __X__2.  On the morning of surgery brush your teeth with toothpaste and water, you                may rinse your mouth with mouthwash if you wish.  Do not swallow any toothpaste of mouthwash.     _X__ 3.  No Alcohol for 24 hours before or after surgery.   _X__ 4.  Do Not Smoke or use e-cigarettes For 24 Hours Prior to Your Surgery.                 Do not use any chewable tobacco products for at least 6 hours prior to                 Surgery.  _X__  5.  Do not use any recreational drugs (marijuana, cocaine, heroin, ecstasy, MDMA or other)                For at least one week prior to your surgery.  Combination of these drugs with anesthesia                May have life threatening results.  ____  6.   Bring all medications with you on the day of surgery if instructed.   __x__  7.  Notify your doctor if there is any change in your medical condition      (cold, fever, infections).     Do not wear jewelry, make-up, hairpins, clips or nail polish. Do not wear lotions, powders, or perfumes.  Do not shave 48 hours prior to surgery. . Do not bring valuables to the hospital.    [facility] is not responsible for any belongings or valuables.  Contacts, dentures or bridgework may not be worn into surgery. Leave your  suitcase in the car. After surgery it may be brought to your room. For patients admitted to the hospital, discharge time is determined by your treatment team.   Patients discharged the day of surgery will not be allowed to drive home.   Make arrangements for someone to be with you for the first 24 hours of your Same Day Discharge.    Please read over the following fact sheets that you were given:       ___x_ Take these medicines the morning of surgery with A SIP OF WATER:    1. acebutolol (SECTRAL) 200 MG capsule  2. flecainide (TAMBOCOR) 100 MG tablet  3. midodrine (PROAMATINE) 10 MG tablet  4.sertraline (ZOLOFT) 50 MG tablet  5.  6.  ____ Fleet Enema (as directed)   __x__ Shower as directed  ____ Use Benzoyl Peroxide Gel as instructed  __x__ Use inhalers on the day of surgeryalbuterol (VENTOLIN HFA) 108 (90 Base) MCG/ACT inhaler  and bring with you  ____ Stop metformin 2 days prior to surgery    ____ Take 1/2 of usual insulin dose the night before surgery. No insulin the morning          of surgery.   ____ Stop Coumadin/Plavix/aspirin on   __x__ Stopped Anti-inflammatories already   ____ Stop supplements until after surgery.    ____ Bring C-Pap to the hospital.    If you have any questions regarding your pre-procedure instructions,  Please call Pre-admit Testing at [phone]

## 2021-01-17 ENCOUNTER — Encounter
Admission: RE | Disposition: A | Discharge status: Home / Self Care | Payer: Self-pay | Source: Home / Self Care | Attending: Surgery

## 2021-01-17 ENCOUNTER — Encounter: Payer: Self-pay | Admitting: Surgery

## 2021-01-17 ENCOUNTER — Ambulatory Visit
Admission: RE | Admit: 2021-01-17 | Discharge: 2021-01-17 | Disposition: A | Discharge status: Home / Self Care | Payer: Medicare Other | Attending: Surgery | Admitting: Surgery

## 2021-01-17 ENCOUNTER — Ambulatory Visit: Payer: Medicare Other | Admitting: Certified Registered"

## 2021-01-17 DIAGNOSIS — K828 Other specified diseases of gallbladder: Secondary | ICD-10-CM | POA: Diagnosis not present

## 2021-01-17 DIAGNOSIS — K811 Chronic cholecystitis: Secondary | ICD-10-CM | POA: Insufficient documentation

## 2021-01-17 DIAGNOSIS — Z79899 Other long term (current) drug therapy: Secondary | ICD-10-CM | POA: Diagnosis not present

## 2021-01-17 DIAGNOSIS — F17200 Nicotine dependence, unspecified, uncomplicated: Secondary | ICD-10-CM | POA: Insufficient documentation

## 2021-01-17 DIAGNOSIS — Z01810 Encounter for preprocedural cardiovascular examination: Secondary | ICD-10-CM | POA: Diagnosis not present

## 2021-01-17 LAB — URINE DRUG SCREEN, QUALITATIVE (ARMC ONLY)
Amphetamines, Ur Screen: NOT DETECTED
Barbiturates, Ur Screen: NOT DETECTED
Benzodiazepine, Ur Scrn: NOT DETECTED
Cannabinoid 50 Ng, Ur ~~LOC~~: POSITIVE — AB
Cocaine Metabolite,Ur ~~LOC~~: NOT DETECTED
MDMA (Ecstasy)Ur Screen: NOT DETECTED
Methadone Scn, Ur: NOT DETECTED
Opiate, Ur Screen: NOT DETECTED
Phencyclidine (PCP) Ur S: NOT DETECTED
Tricyclic, Ur Screen: NOT DETECTED

## 2021-01-17 LAB — POCT PREGNANCY, URINE: Preg Test, Ur: NEGATIVE

## 2021-01-17 SURGERY — CHOLECYSTECTOMY, ROBOT-ASSISTED, LAPAROSCOPIC
Anesthesia: General

## 2021-01-17 MED ORDER — DEXAMETHASONE SODIUM PHOSPHATE 10 MG/ML IJ SOLN
INTRAMUSCULAR | Status: DC | PRN
  Administered 2021-01-17: 10 mg via INTRAVENOUS

## 2021-01-17 MED ORDER — LACTATED RINGERS IV SOLN: INTRAVENOUS | Status: DC | PRN

## 2021-01-17 MED ORDER — BUPIVACAINE-EPINEPHRINE (PF) 0.25% -1:200000 IJ SOLN
INTRAMUSCULAR | Status: DC | PRN
  Administered 2021-01-17: 30 mL

## 2021-01-17 MED ORDER — BUPIVACAINE-EPINEPHRINE (PF) 0.25% -1:200000 IJ SOLN
INTRAMUSCULAR | Status: AC
  Filled 2021-01-17: qty 30

## 2021-01-17 MED ORDER — PROPOFOL 10 MG/ML IV BOLUS
INTRAVENOUS | Status: AC
  Filled 2021-01-17: qty 20

## 2021-01-17 MED ORDER — PROMETHAZINE HCL 25 MG/ML IJ SOLN: 6.2500 mg | INTRAMUSCULAR | Status: DC | PRN

## 2021-01-17 MED ORDER — ACETAMINOPHEN 500 MG PO TABS: 1000.0000 mg | ORAL_TABLET | Freq: Four times a day (QID) | ORAL | Status: AC | PRN

## 2021-01-17 MED ORDER — CEFAZOLIN SODIUM-DEXTROSE 2-4 GM/100ML-% IV SOLN
INTRAVENOUS | Status: AC
  Filled 2021-01-17: qty 100

## 2021-01-17 MED ORDER — OXYCODONE HCL 5 MG PO TABS: 5.0000 mg | ORAL_TABLET | ORAL | 0 refills | Status: DC | PRN

## 2021-01-17 MED ORDER — FENTANYL CITRATE (PF) 100 MCG/2ML IJ SOLN
INTRAMUSCULAR | Status: DC | PRN
  Administered 2021-01-17 (×2): 50 ug via INTRAVENOUS

## 2021-01-17 MED ORDER — MIDAZOLAM HCL 2 MG/2ML IJ SOLN
INTRAMUSCULAR | Status: DC | PRN
  Administered 2021-01-17: 2 mg via INTRAVENOUS

## 2021-01-17 MED ORDER — GLYCOPYRROLATE 0.2 MG/ML IJ SOLN
INTRAMUSCULAR | Status: DC | PRN
  Administered 2021-01-17: .4 mg via INTRAVENOUS

## 2021-01-17 MED ORDER — CHLORHEXIDINE GLUCONATE 0.12 % MT SOLN
OROMUCOSAL | Status: AC
  Filled 2021-01-17: qty 15

## 2021-01-17 MED ORDER — FENTANYL CITRATE (PF) 100 MCG/2ML IJ SOLN
INTRAMUSCULAR | Status: AC
  Filled 2021-01-17: qty 2

## 2021-01-17 MED ORDER — LIDOCAINE HCL (CARDIAC) PF 100 MG/5ML IV SOSY
PREFILLED_SYRINGE | INTRAVENOUS | Status: DC | PRN
  Administered 2021-01-17: 100 mg via INTRAVENOUS

## 2021-01-17 MED ORDER — FENTANYL CITRATE (PF) 100 MCG/2ML IJ SOLN
INTRAMUSCULAR | Status: AC
  Administered 2021-01-17: 25 ug via INTRAVENOUS
  Filled 2021-01-17: qty 2

## 2021-01-17 MED ORDER — FENTANYL CITRATE (PF) 100 MCG/2ML IJ SOLN
25.0000 ug | INTRAMUSCULAR | Status: DC | PRN
  Administered 2021-01-17: 25 ug via INTRAVENOUS

## 2021-01-17 MED ORDER — SUGAMMADEX SODIUM 200 MG/2ML IV SOLN
INTRAVENOUS | Status: DC | PRN
  Administered 2021-01-17: 200 mg via INTRAVENOUS

## 2021-01-17 MED ORDER — PROPOFOL 10 MG/ML IV BOLUS
INTRAVENOUS | Status: DC | PRN
  Administered 2021-01-17: 160 mg via INTRAVENOUS

## 2021-01-17 MED ORDER — MIDAZOLAM HCL 2 MG/2ML IJ SOLN
INTRAMUSCULAR | Status: AC
  Filled 2021-01-17: qty 2

## 2021-01-17 MED ORDER — ROCURONIUM BROMIDE 100 MG/10ML IV SOLN
INTRAVENOUS | Status: DC | PRN
  Administered 2021-01-17: 50 mg via INTRAVENOUS
  Administered 2021-01-17: 20 mg via INTRAVENOUS

## 2021-01-17 MED ORDER — SODIUM CHLORIDE FLUSH 0.9 % IV SOLN
INTRAVENOUS | Status: AC
  Filled 2021-01-17: qty 10

## 2021-01-17 MED ORDER — OXYCODONE HCL 5 MG PO TABS: 5.0000 mg | ORAL_TABLET | ORAL | Status: DC | PRN

## 2021-01-17 MED ORDER — PROMETHAZINE HCL 25 MG/ML IJ SOLN
INTRAMUSCULAR | Status: AC
  Administered 2021-01-17: 12.5 mg via INTRAVENOUS
  Filled 2021-01-17: qty 1

## 2021-01-17 MED ORDER — IBUPROFEN 800 MG PO TABS
800.0000 mg | ORAL_TABLET | Freq: Three times a day (TID) | ORAL | 1 refills | Status: AC | PRN
Start: 2021-01-17 — End: ?

## 2021-01-17 MED ORDER — ONDANSETRON HCL 4 MG/2ML IJ SOLN
INTRAMUSCULAR | Status: DC | PRN
  Administered 2021-01-17: 4 mg via INTRAVENOUS

## 2021-01-17 MED ORDER — EPHEDRINE SULFATE 50 MG/ML IJ SOLN
INTRAMUSCULAR | Status: DC | PRN
  Administered 2021-01-17: 10 mg via INTRAVENOUS

## 2021-01-17 MED ORDER — GABAPENTIN 300 MG PO CAPS
ORAL_CAPSULE | ORAL | Status: AC
  Administered 2021-01-17: 300 mg via ORAL
  Filled 2021-01-17: qty 1

## 2021-01-17 MED ORDER — OXYCODONE HCL 5 MG PO TABS
ORAL_TABLET | ORAL | Status: AC
  Administered 2021-01-17: 5 mg via ORAL
  Filled 2021-01-17: qty 1

## 2021-01-17 MED ORDER — ACETAMINOPHEN 500 MG PO TABS
ORAL_TABLET | ORAL | Status: AC
  Administered 2021-01-17: 1000 mg via ORAL
  Filled 2021-01-17: qty 2

## 2021-01-17 SURGICAL SUPPLY — 57 items
ADH SKN CLS APL DERMABOND .7 (GAUZE/BANDAGES/DRESSINGS) ×1
APL PRP STRL LF DISP 70% ISPRP (MISCELLANEOUS) ×1
BAG INFUSER PRESSURE 100CC (MISCELLANEOUS) IMPLANT
BAG SPEC RTRVL LRG 6X4 10 (ENDOMECHANICALS) ×1
CANNULA REDUC XI 12-8 STAPL (CANNULA) ×1
CANNULA REDUCER 12-8 DVNC XI (CANNULA) ×1 IMPLANT
CHLORAPREP W/TINT 26 (MISCELLANEOUS) ×2 IMPLANT
CLIP VESOLOCK MED LG 6/CT (CLIP) ×2 IMPLANT
COVER WAND RF STERILE (DRAPES) ×2 IMPLANT
CUP MEDICINE 2OZ PLAST GRAD ST (MISCELLANEOUS) IMPLANT
DECANTER SPIKE VIAL GLASS SM (MISCELLANEOUS) ×2 IMPLANT
DEFOGGER SCOPE WARMER CLEARIFY (MISCELLANEOUS) ×2 IMPLANT
DERMABOND ADVANCED (GAUZE/BANDAGES/DRESSINGS) ×1
DERMABOND ADVANCED .7 DNX12 (GAUZE/BANDAGES/DRESSINGS) ×1 IMPLANT
DRAPE ARM DVNC X/XI (DISPOSABLE) ×4 IMPLANT
DRAPE COLUMN DVNC XI (DISPOSABLE) ×1 IMPLANT
DRAPE DA VINCI XI ARM (DISPOSABLE) ×4
DRAPE DA VINCI XI COLUMN (DISPOSABLE) ×2
ELECT CAUTERY BLADE TIP 2.5 (TIP) ×2
ELECT REM PT RETURN 9FT ADLT (ELECTROSURGICAL) ×2
ELECTRODE CAUTERY BLDE TIP 2.5 (TIP) ×1 IMPLANT
ELECTRODE REM PT RTRN 9FT ADLT (ELECTROSURGICAL) ×1 IMPLANT
GLOVE SURG SYN 7.0 (GLOVE) ×4 IMPLANT
GLOVE SURG SYN 7.5  E (GLOVE) ×4
GLOVE SURG SYN 7.5 E (GLOVE) ×2 IMPLANT
GOWN STRL REUS W/ TWL LRG LVL3 (GOWN DISPOSABLE) ×4 IMPLANT
GOWN STRL REUS W/TWL LRG LVL3 (GOWN DISPOSABLE) ×8
IRRIGATOR SUCT 8 DISP DVNC XI (IRRIGATION / IRRIGATOR) IMPLANT
IRRIGATOR SUCTION 8MM XI DISP (IRRIGATION / IRRIGATOR)
IV NS 1000ML (IV SOLUTION)
IV NS 1000ML BAXH (IV SOLUTION) IMPLANT
KIT PINK PAD W/HEAD ARE REST (MISCELLANEOUS) ×2
KIT PINK PAD W/HEAD ARM REST (MISCELLANEOUS) ×1 IMPLANT
LABEL OR SOLS (LABEL) ×2 IMPLANT
MANIFOLD NEPTUNE II (INSTRUMENTS) ×2 IMPLANT
NEEDLE HYPO 22GX1.5 SAFETY (NEEDLE) ×2 IMPLANT
NS IRRIG 500ML POUR BTL (IV SOLUTION) ×2 IMPLANT
OBTURATOR OPTICAL STANDARD 8MM (TROCAR) ×1
OBTURATOR OPTICAL STND 8 DVNC (TROCAR) ×1
OBTURATOR OPTICALSTD 8 DVNC (TROCAR) ×1 IMPLANT
PACK LAP CHOLECYSTECTOMY (MISCELLANEOUS) ×2 IMPLANT
PENCIL ELECTRO HAND CTR (MISCELLANEOUS) ×2 IMPLANT
POUCH SPECIMEN RETRIEVAL 10MM (ENDOMECHANICALS) ×2 IMPLANT
SEAL CANN UNIV 5-8 DVNC XI (MISCELLANEOUS) ×3 IMPLANT
SEAL XI 5MM-8MM UNIVERSAL (MISCELLANEOUS) ×3
SET TUBE SMOKE EVAC HIGH FLOW (TUBING) ×2 IMPLANT
SOLUTION ELECTROLUBE (MISCELLANEOUS) ×2 IMPLANT
SPONGE LAP 18X18 RF (DISPOSABLE) IMPLANT
SPONGE LAP 4X18 RFD (DISPOSABLE) ×2 IMPLANT
STAPLER CANNULA SEAL DVNC XI (STAPLE) ×1 IMPLANT
STAPLER CANNULA SEAL XI (STAPLE) ×1
SUT MNCRL AB 4-0 PS2 18 (SUTURE) ×2 IMPLANT
SUT VIC AB 3-0 SH 27 (SUTURE)
SUT VIC AB 3-0 SH 27X BRD (SUTURE) IMPLANT
SUT VICRYL 0 AB UR-6 (SUTURE) ×4 IMPLANT
TAPE TRANSPORE STRL 2 31045 (GAUZE/BANDAGES/DRESSINGS) ×2 IMPLANT
TROCAR BALLN GELPORT 12X130M (ENDOMECHANICALS) ×2 IMPLANT

## 2021-01-17 NOTE — Interval H&P Note (Signed)
History and Physical Interval Note:  01/17/2021 9:09 AM  Taylor Copeland  has presented today for surgery, with the diagnosis of biliary dyskinesia.  The various methods of treatment have been discussed with the patient and family. After consideration of risks, benefits and other options for treatment, the patient has consented to  Procedure(s): XI ROBOTIC ASSISTED LAPAROSCOPIC CHOLECYSTECTOMY (N/A) INDOCYANINE GREEN FLUORESCENCE IMAGING (ICG) (N/A) as a surgical intervention.  The patient's history has been reviewed, patient examined, no change in status, stable for surgery.  I have reviewed the patient's chart and labs.  Questions were answered to the patient's satisfaction.     Sim Choquette

## 2021-01-17 NOTE — Discharge Instructions (Addendum)
AMBULATORY SURGERY  DISCHARGE INSTRUCTIONS   The drugs that you were given will stay in your system until tomorrow so for the next 24 hours you should not:  Drive an automobile Make any legal decisions Drink any alcoholic beverage   You may resume regular meals tomorrow.  Today it is better to start with liquids and gradually work up to solid foods.  You may eat anything you prefer, but it is better to start with liquids, then soup and crackers, and gradually work up to solid foods.   Please notify your doctor immediately if you have any unusual bleeding, trouble breathing, redness and pain at the surgery site, drainage, fever, or pain not relieved by medication.       Please contact your physician with any problems or Same Day Surgery at [phone], Monday through Friday 6 am to 4 pm, or [place] [facility] [place] [facility] number at [phone].  

## 2021-01-17 NOTE — Anesthesia Preprocedure Evaluation (Signed)
Anesthesia Evaluation  Patient identified by MRN, date of birth, ID band Patient awake    Reviewed: Allergy & Precautions, H&P , NPO status , Patient's Chart, lab work & pertinent test results, reviewed documented beta blocker date and time   History of Anesthesia Complications Negative for: history of anesthetic complications  Airway Mallampati: I  TM Distance: >3 FB Neck ROM: full    Dental  (+) Dental Advidsory Given, Teeth Intact   Pulmonary neg shortness of breath, asthma , neg COPD, neg recent URI, Current Smoker,    Pulmonary exam normal breath sounds clear to auscultation       Cardiovascular Exercise Tolerance: Good (-) hypertension(-) angina(-) Past MI and (-) Cardiac Stents Normal cardiovascular exam+ dysrhythmias (neurogenic syncope) (-) Valvular Problems/Murmurs Rhythm:regular Rate:Normal     Neuro/Psych negative neurological ROS  negative psych ROS   GI/Hepatic Neg liver ROS, GERD  ,  Endo/Other  negative endocrine ROS  Renal/GU negative Renal ROS  negative genitourinary   Musculoskeletal   Abdominal   Peds  Hematology negative hematology ROS (+)   Anesthesia Other Findings Past Medical History: No date: Syncope   Reproductive/Obstetrics negative OB ROS                             Anesthesia Physical  Anesthesia Plan  ASA: 2  Anesthesia Plan: General   Post-op Pain Management:    Induction: Intravenous  PONV Risk Score and Plan: 2 and Ondansetron, Dexamethasone, Midazolam and Promethazine  Airway Management Planned: Oral ETT  Additional Equipment:   Intra-op Plan:   Post-operative Plan: Extubation in OR  Informed Consent: I have reviewed the patients History and Physical, chart, labs and discussed the procedure including the risks, benefits and alternatives for the proposed anesthesia with the patient or authorized representative who has indicated his/her  understanding and acceptance.     Dental Advisory Given  Plan Discussed with: Anesthesiologist, CRNA and Surgeon  Anesthesia Plan Comments:         Anesthesia Quick Evaluation

## 2021-01-17 NOTE — Op Note (Signed)
  Procedure Date:  01/17/2021  Pre-operative Diagnosis:  Biliary dyskinesia  Post-operative Diagnosis: Biliary dyskinesia  Procedure:  Robotic assisted cholecystectomy with ICG FireFly cholangiogram  Surgeon:  Howie Ill, MD  Assistant:  Frederic Jericho, PA-S  Anesthesia:  General endotracheal  Estimated Blood Loss:  5 ml  Specimens:  gallbladder  Complications:  None  Indications for Procedure:  This is a 45 y.o. female who presents with abdominal pain and workup revealing biliary dyskinesia.  The benefits, complications, treatment options, and expected outcomes were discussed with the patient. The risks of bleeding, infection, recurrence of symptoms, failure to resolve symptoms, bile duct damage, bile duct leak, retained common bile duct stone, bowel injury, and need for further procedures were all discussed with the patient and she was willing to proceed.  Description of Procedure: The patient was correctly identified in the preoperative area and brought into the operating room.  The patient was placed supine with VTE prophylaxis in place.  Appropriate time-outs were performed.  Anesthesia was induced and the patient was intubated.  Appropriate antibiotics were infused.  The abdomen was prepped and draped in a sterile fashion. An infraumbilical incision was made. A cutdown technique was used to enter the abdominal cavity without injury, and a 12 mm robotic port was inserted.  Pneumoperitoneum was obtained with appropriate opening pressures.  Three 8-mm ports were placed in the mid abdomen at the level of the umbilicus under direct visualization.  The DaVinci platform was docked, camera targeted, and instruments were placed under direct visualization.  The gallbladder was identified.  The fundus was grasped and retracted cephalad.  Adhesions were lysed bluntly and with electrocautery. The infundibulum was grasped and retracted laterally, exposing the peritoneum overlying the gallbladder.   This was incised with electrocautery and extended on either side of the gallbladder.  FireFly cholangiogram was then obtained, and we were able to clearly identify the cystic duct and common bile duct.  The cystic duct and cystic artery were carefully dissected with combination of cautery and blunt dissection.  Both were clipped twice proximally and once distally, cutting in between.  The gallbladder was taken from the gallbladder fossa in a retrograde fashion with electrocautery. The gallbladder was placed in an Endocatch bag. The liver bed was inspected and any bleeding was controlled with electrocautery. The right upper quadrant was then inspected again revealing intact clips, no bleeding, and no ductal injury.  The 8 mm ports were removed under direct visualization and the 12 mm port was removed.  The Endocatch bag was brought out via the umbilical incision. The fascial opening was closed using 0 vicryl sutures.  Local anesthetic was infused in all incisions.  The infraumbilical incision was closed using 3-0 vicryl and 4-0 Monocryl, and the other port incisions were closed with 4-0 Monocryl.  The wounds were cleaned and sealed with DermaBond.  The patient was emerged from anesthesia and extubated and brought to the recovery room for further management.  The patient tolerated the procedure well and all counts were correct at the end of the case.   Howie Ill, MD

## 2021-01-17 NOTE — Transfer of Care (Signed)
Immediate Anesthesia Transfer of Care Note  Patient: Taylor Copeland  Procedure(s) Performed: XI ROBOTIC ASSISTED LAPAROSCOPIC CHOLECYSTECTOMY INDOCYANINE GREEN FLUORESCENCE IMAGING (ICG)  Patient Location: PACU  Anesthesia Type:General  Level of Consciousness: awake, alert  and oriented  Airway & Oxygen Therapy: Patient Spontanous Breathing and Patient connected to face mask oxygen  Post-op Assessment: Report given to RN and Post -op Vital signs reviewed and stable  Post vital signs: Reviewed and stable  Last Vitals:  Vitals Value Taken Time  BP    Temp    Pulse 59 01/17/21 1135  Resp 10 01/17/21 1135  SpO2 100 % 01/17/21 1135  Vitals shown include unvalidated device data.  Last Pain:  Vitals:   01/17/21 0824  TempSrc: Temporal  PainSc: 0-No pain         Complications: No notable events documented.

## 2021-01-17 NOTE — Anesthesia Procedure Notes (Signed)
Procedure Name: Intubation Date/Time: 01/17/2021 9:56 AM Performed by: Danelle Berry, CRNA Pre-anesthesia Checklist: Patient identified, Emergency Drugs available, Suction available and Patient being monitored Patient Re-evaluated:Patient Re-evaluated prior to induction Oxygen Delivery Method: Circle system utilized Preoxygenation: Pre-oxygenation with 100% oxygen Induction Type: IV induction Ventilation: Mask ventilation without difficulty Laryngoscope Size: McGraph and 3 Grade View: Grade I Tube type: Oral Tube size: 7.0 mm Number of attempts: 1 Airway Equipment and Method: Stylet and Oral airway Placement Confirmation: ETT inserted through vocal cords under direct vision, positive ETCO2 and breath sounds checked- equal and bilateral Secured at: 22 cm Tube secured with: Tape Dental Injury: Teeth and Oropharynx as per pre-operative assessment

## 2021-01-18 LAB — SURGICAL PATHOLOGY

## 2021-01-18 NOTE — Anesthesia Postprocedure Evaluation (Signed)
Anesthesia Post Note  Patient: Taylor Copeland  Procedure(s) Performed: XI ROBOTIC ASSISTED LAPAROSCOPIC CHOLECYSTECTOMY INDOCYANINE GREEN FLUORESCENCE IMAGING (ICG)  Patient location during evaluation: PACU Anesthesia Type: General Level of consciousness: awake and alert Pain management: pain level controlled Vital Signs Assessment: post-procedure vital signs reviewed and stable Respiratory status: spontaneous breathing, nonlabored ventilation, respiratory function stable and patient connected to nasal cannula oxygen Cardiovascular status: blood pressure returned to baseline and stable Postop Assessment: no apparent nausea or vomiting Anesthetic complications: no   No notable events documented.   Last Vitals:  Vitals:   01/17/21 1300 01/17/21 1341  BP: 137/86 106/84  Pulse: 61 63  Resp: 15 16  Temp:  36.8 C  SpO2: 96% 100%    Last Pain:  Vitals:   01/17/21 1341  TempSrc: Temporal  PainSc: 5                  Lenard Simmer

## 2021-01-31 ENCOUNTER — Encounter: Payer: Self-pay | Admitting: Surgery

## 2021-01-31 ENCOUNTER — Ambulatory Visit (INDEPENDENT_AMBULATORY_CARE_PROVIDER_SITE_OTHER): Payer: Medicare Other | Admitting: Surgery

## 2021-01-31 ENCOUNTER — Other Ambulatory Visit: Payer: Self-pay

## 2021-01-31 VITALS — BP 117/76 | HR 59 | Temp 98.1°F | Ht 64.0 in | Wt 176.6 lb

## 2021-01-31 DIAGNOSIS — K828 Other specified diseases of gallbladder: Secondary | ICD-10-CM

## 2021-01-31 NOTE — Patient Instructions (Signed)

## 2021-01-31 NOTE — Progress Notes (Signed)
01/31/2021  HPI: Taylor Copeland is a 45 y.o. female s/p robotic assisted cholecystectomy for biliary dyskinesia on 7/16.  Patient presents for follow up.  Reports marked improvement in her symptoms compared to preop.  Denies any further nausea or vomiting, and only reports some soreness at the incisions.  Vital signs: BP 117/76   Pulse (!) 59   Temp 98.1 F (36.7 C) (Oral)   Ht 5\' 4"  (1.626 m)   Wt 176 lb 9.6 oz (80.1 kg)   LMP 01/09/2021 Comment: barely spotting  SpO2 96%   BMI 30.31 kg/m    Physical Exam: Constitutional:  No acute distress Abdomen:  soft, non-distended, non-tender.  Incisions healing well, clean, dry, intact.  Mild surrounding ecchymosis.  No evidence of hernia or wound breakdown.  Assessment/Plan: This is a 45 y.o. female s/p robotic assisted cholecystectomy.  --Patient is healing well  Marked improvement in her symptoms.  Reveals some mild indigestion or gassiness, but no further nausea or emesis.  This should continue to improve as she heals and her body adjusts to not having a gallbladder. --Follow up prn.   48, MD [facility]

## 2021-10-21 ENCOUNTER — Encounter: Payer: Self-pay | Admitting: Emergency Medicine

## 2021-10-21 ENCOUNTER — Emergency Department: Payer: Medicare Other

## 2021-10-21 ENCOUNTER — Emergency Department
Admission: EM | Admit: 2021-10-21 | Discharge: 2021-10-21 | Disposition: A | Discharge status: Home / Self Care | Payer: Medicare Other | Attending: Emergency Medicine | Admitting: Emergency Medicine

## 2021-10-21 ENCOUNTER — Other Ambulatory Visit: Payer: Self-pay

## 2021-10-21 DIAGNOSIS — R1084 Generalized abdominal pain: Secondary | ICD-10-CM | POA: Diagnosis present

## 2021-10-21 DIAGNOSIS — R112 Nausea with vomiting, unspecified: Secondary | ICD-10-CM | POA: Diagnosis not present

## 2021-10-21 DIAGNOSIS — K529 Noninfective gastroenteritis and colitis, unspecified: Secondary | ICD-10-CM

## 2021-10-21 DIAGNOSIS — R748 Abnormal levels of other serum enzymes: Secondary | ICD-10-CM | POA: Insufficient documentation

## 2021-10-21 LAB — COMPREHENSIVE METABOLIC PANEL
ALT: 14 U/L (ref 0–44)
AST: 21 U/L (ref 15–41)
Albumin: 4.3 g/dL (ref 3.5–5.0)
Alkaline Phosphatase: 48 U/L (ref 38–126)
Anion gap: 10 (ref 5–15)
BUN: 17 mg/dL (ref 6–20)
CO2: 22 mmol/L (ref 22–32)
Calcium: 9.1 mg/dL (ref 8.9–10.3)
Chloride: 107 mmol/L (ref 98–111)
Creatinine, Ser: 0.74 mg/dL (ref 0.44–1.00)
GFR, Estimated: >60 mL/min (ref >60)
Glucose, Bld: 143 mg/dL — ABNORMAL HIGH (ref 70–99)
Potassium: 3.8 mmol/L (ref 3.5–5.1)
Sodium: 139 mmol/L (ref 135–145)
Total Bilirubin: 0.6 mg/dL (ref 0.3–1.2)
Total Protein: 7.3 g/dL (ref 6.5–8.1)

## 2021-10-21 LAB — CBC
HCT: 41.6 % (ref 36.0–46.0)
Hemoglobin: 14.1 g/dL (ref 12.0–15.0)
MCH: 31.3 pg (ref 26.0–34.0)
MCHC: 33.9 g/dL (ref 30.0–36.0)
MCV: 92.2 fL (ref 80.0–100.0)
Platelets: 238 10*3/uL (ref 150–400)
RBC: 4.51 MIL/uL (ref 3.87–5.11)
RDW: 12.3 % (ref 11.5–15.5)
WBC: 7.3 10*3/uL (ref 4.0–10.5)
nRBC: 0 % (ref 0.0–0.2)

## 2021-10-21 LAB — LIPASE, BLOOD: Lipase: 56 U/L — ABNORMAL HIGH (ref 11–51)

## 2021-10-21 MED ORDER — SUCRALFATE 1 G PO TABS: 1.0000 g | ORAL_TABLET | Freq: Four times a day (QID) | ORAL | 1 refills | Status: AC | PRN

## 2021-10-21 MED ORDER — PROMETHAZINE HCL 25 MG RE SUPP: 25.0000 mg | Freq: Four times a day (QID) | RECTAL | 0 refills | Status: AC | PRN

## 2021-10-21 MED ORDER — LACTATED RINGERS IV BOLUS
1000.0000 mL | Freq: Once | INTRAVENOUS | Status: AC
  Administered 2021-10-21: 1000 mL via INTRAVENOUS

## 2021-10-21 MED ORDER — DROPERIDOL 2.5 MG/ML IJ SOLN
2.5000 mg | Freq: Once | INTRAMUSCULAR | Status: AC
  Administered 2021-10-21: 2.5 mg via INTRAVENOUS
  Filled 2021-10-21: qty 2

## 2021-10-21 MED ORDER — DIPHENHYDRAMINE HCL 50 MG/ML IJ SOLN
12.5000 mg | INTRAMUSCULAR | Status: DC
  Filled 2021-10-21: qty 1

## 2021-10-21 MED ORDER — ONDANSETRON HCL 4 MG/2ML IJ SOLN
4.0000 mg | Freq: Once | INTRAMUSCULAR | Status: AC | PRN
  Administered 2021-10-21: 4 mg via INTRAVENOUS
  Filled 2021-10-21: qty 2

## 2021-10-21 MED ORDER — MORPHINE SULFATE (PF) 4 MG/ML IV SOLN
4.0000 mg | Freq: Once | INTRAVENOUS | Status: AC
  Administered 2021-10-21: 4 mg via INTRAVENOUS
  Filled 2021-10-21: qty 1

## 2021-10-21 MED ORDER — IOHEXOL 300 MG/ML  SOLN
100.0000 mL | Freq: Once | INTRAMUSCULAR | Status: AC | PRN
  Administered 2021-10-21: 100 mL via INTRAVENOUS

## 2021-10-21 NOTE — Discharge Instructions (Addendum)

## 2021-10-21 NOTE — ED Notes (Signed)
Pt returned from CT °

## 2021-10-21 NOTE — ED Notes (Signed)
Patient transported to CT 

## 2021-10-21 NOTE — ED Triage Notes (Addendum)
Pt arrived via POV with reports of vomiting and abd pain x 6 days. Pt dry heaving in triage, states she took ODT zofran with no relief.  ?

## 2021-10-21 NOTE — ED Provider Notes (Signed)
? ?[facility] ?Provider Note ? ? ? Event Date/Time  ? First MD Initiated Contact with Patient 10/21/21 0507   ?  (approximate) ? ? ?History  ? ?Abdominal Pain ? ? ?HPI ? ?Taylor Copeland is a 46 y.o. female  who has had GI problems in the past and has had a cholecystectomy, appendectomy, tubal ligation, and a ventral hernia surgery.   She presents for evaluation of persistent nausea and vomiting as well as generalized abdominal pain.  Is been present for at least 6 days but has been gradually worsening and now the abdominal pain and vomiting are very severe.  She cannot keep anything down.  Nothing in particular makes it better or worse.  She denies fever, chest pain, and shortness of breath. ? ?  ? ? ?Physical Exam  ? ?Triage Vital Signs: ?ED Triage Vitals  ?Enc Vitals Group  ?   BP 10/21/21 0435 122/80  ?   Pulse Rate 10/21/21 0435 64  ?   Resp 10/21/21 0435 (!) 22  ?   Temp 10/21/21 0500 98.2 ?F (36.8 ?C)  ?   Temp Source 10/21/21 0500 Oral  ?   SpO2 10/21/21 0435 100 %  ?   Weight 10/21/21 0431 68 kg (150 lb)  ?   Height 10/21/21 0431 1.626 m (5\' 4" )  ?   Head Circumference --   ?   Peak Flow --   ?   Pain Score 10/21/21 0430 6  ?   Pain Loc --   ?   Pain Edu? --   ?   Excl. in GC? --   ? ? ?Most recent vital signs: ?Vitals:  ? 10/21/21 0630 10/21/21 0700  ?BP:    ?Pulse: 80 76  ?Resp:  17  ?Temp:    ?SpO2: 97% 93%  ? ? ? ?General: Awake, extensive dry heaves but without producing any emesis, appears very uncomfortable. ?CV:  Good peripheral perfusion.  ?Resp:  Normal effort.  Lungs are clear to auscultation. ?Abd:  No distention.  Well-healed surgical scars in multiple locations.  Tenderness to palpation throughout the abdomen with some guarding in the upper abdomen. ? ? ?ED Results / Procedures / Treatments  ? ?Labs ?(all labs ordered are listed, but only abnormal results are displayed) ?Labs Reviewed  ?LIPASE, BLOOD - Abnormal; Notable for the following components:  ?    Result Value  ?  Lipase 56 (*)   ? All other components within normal limits  ?COMPREHENSIVE METABOLIC PANEL - Abnormal; Notable for the following components:  ? Glucose, Bld 143 (*)   ? All other components within normal limits  ?CBC  ?URINALYSIS, ROUTINE W REFLEX MICROSCOPIC  ? ? ? ?EKG ? ?ED ECG REPORT ?I03/28/23, the attending physician, personally viewed and interpreted this ECG. ? ?Date: 10/21/2021 ?EKG Time: 4:58 AM ?Rate: 67 ?Rhythm: normal sinus rhythm with sinus arrhythmia ?QRS Axis: normal ?Intervals: normal ?ST/T Wave abnormalities: normal ?Narrative Interpretation: no evidence of acute ischemia ? ? ? ?RADIOLOGY ?Enteritis on CT scan but no evidence of emergent medical condition. ? ? ? ?PROCEDURES: ? ?Critical Care performed: No ? ?Procedures ? ? ?MEDICATIONS ORDERED IN ED: ?Medications  ?diphenhydrAMINE (BENADRYL) injection 12.5 mg (0 mg Intravenous Hold 10/21/21 0706)  ?ondansetron [facility]) injection 4 mg (4 mg Intravenous Given 10/21/21 0449)  ?droperidol (INAPSINE) 2.5 MG/ML injection 2.5 mg (2.5 mg Intravenous Given 10/21/21 0532)  ?morphine (PF) 4 MG/ML injection 4 mg (4 mg Intravenous Given 10/21/21 10/23/21)  ?  lactated ringers bolus 1,000 mL (1,000 mLs Intravenous New Bag/Given 10/21/21 3220)  ?iohexol (OMNIPAQUE) 300 MG/ML solution 100 mL (100 mLs Intravenous Contrast Given 10/21/21 0633)  ? ? ? ?IMPRESSION / MDM / ASSESSMENT AND PLAN / ED COURSE  ?I reviewed the triage vital signs and the nursing notes. ?             ?               ? ?Differential diagnosis includes, but is not limited to, SBO, partial SBO, nonspecific gastritis, pancreatitis, electrolyte or metabolic abnormality, volvulus. ? ?Given the extensive surgical history, my greatest suspicion is for SBO/ileus.  The patient received 4 mg of Zofran with no improvement.  I then ordered droperidol 2.5 mg IV for refractory nausea and vomiting.  This helped a little bit but she is still uncomfortable.  I then ordered morphine 4 mg IV and diphenhydramine 12.5  mg IV to see if this will help potentiate the effects of the other medications and give her some relief. ? ?Her vital signs are stable except for bradycardia but she is on flecainide for atrial tachycardia and vasovagal syncope.  There is no evidence of an acute or emergent cardiac condition at this time.  I reviewed her EKG and her intervals are appropriate for the administration of the Zofran and droperidol, particularly given the severity of her current symptoms.  The benefits outweigh the risks. ? ?Labs ordered include comprehensive metabolic panel, lipase, and CBC.  I reviewed the results and her comprehensive metabolic panel is essentially normal, lipase is very slightly elevated at 56 which could simply be the result of all of her vomiting or could represent pancreatitis, and her CBC is within normal limits.  She has not been able to provide a urine specimen. ? ?Proceeding with CT scan of the abdomen pelvis with IV contrast to evaluate for SBO, ileus, or other acute intra-abdominal pathology.  Patient and husband understand and agree with the plan. ? ?Clinical Course as of 10/21/21 0733  ?[name] Oct 21, 2021  ?0715 I personally reviewed the patient's CT scan results.  She has some inflammation in the small bowel but no evidence of obstruction.  The radiologist comments that there is evidence of small bowel enteritis throughout but no emergent abnormality and no obstruction. [CF]  ?2542 I reassessed the patient and she said that she is feeling considerably better.  I gave her the reassuring news that she has some enteritis on CT scan but no emergent abnormalities.  She said that makes her feel much better given all the problems she has had in the past.  She commented that multiple members in her family have had vomiting and diarrhea recently and they assumed that a bug was going around, but she was the only one for him the symptoms stuck around. ? ?No indication for antibiotics.  I will write prescriptions as  listed below and she will follow-up as needed.  I gave her contact information for GI follow-up if she would like to pursue it.  I gave my usual and customary return precautions. ? ? [CF]  ?  ?Clinical Course User Index ?[CF] Loleta Rose, MD  ? ? ? ?FINAL CLINICAL IMPRESSION(S) / ED DIAGNOSES  ? ?Final diagnoses:  ?Generalized abdominal pain  ?Nausea and vomiting, unspecified vomiting type  ? ? ? ?Rx / DC Orders  ? ?ED Discharge Orders   ? ?      Ordered  ?  promethazine (PHENERGAN) 25  MG suppository  Every 6 hours PRN       ? 10/21/21 0729  ?  sucralfate (CARAFATE) 1 g tablet  4 times daily PRN       ? 10/21/21 0729  ? ?  ?  ? ?  ? ? ? ?Note:  This document was prepared using Dragon voice recognition software and may include unintentional dictation errors. ?  ?Loleta RoseForbach, Kalum Minner, MD ?10/21/21 [phone] ? ?

## 2021-12-10 LAB — LIPID PROFILE (EXT)
Chol/HDL Ratio (EXT): 5.8 — ABNORMAL HIGH (ref <4.5)
Cholesterol (EXT): 261 mg/dL — ABNORMAL HIGH (ref 25–199)
HDL Cholesterol (EXT): 45 mg/dL (ref 35–135)
LDL Cholesterol (EXT): 187 mg/dL — ABNORMAL HIGH (ref 0–99)
NON HDL Cholesterol (EXT): 216 mg/dL
Triglycerides (EXT): 144 mg/dL (ref 10–150)

## 2022-01-25 ENCOUNTER — Emergency Department: Payer: Medicare Other

## 2022-01-25 ENCOUNTER — Emergency Department
Admission: EM | Admit: 2022-01-25 | Discharge: 2022-01-25 | Disposition: A | Discharge status: Home / Self Care | Payer: Medicare Other | Attending: Emergency Medicine | Admitting: Emergency Medicine

## 2022-01-25 ENCOUNTER — Other Ambulatory Visit: Payer: Self-pay

## 2022-01-25 DIAGNOSIS — R112 Nausea with vomiting, unspecified: Secondary | ICD-10-CM | POA: Diagnosis present

## 2022-01-25 DIAGNOSIS — E860 Dehydration: Secondary | ICD-10-CM | POA: Diagnosis not present

## 2022-01-25 DIAGNOSIS — K529 Noninfective gastroenteritis and colitis, unspecified: Secondary | ICD-10-CM | POA: Insufficient documentation

## 2022-01-25 LAB — COMPREHENSIVE METABOLIC PANEL
ALT: 18 U/L (ref 0–44)
AST: 32 U/L (ref 15–41)
Albumin: 5.2 g/dL — ABNORMAL HIGH (ref 3.5–5.0)
Alkaline Phosphatase: 52 U/L (ref 38–126)
Anion gap: 12 (ref 5–15)
BUN: 18 mg/dL (ref 6–20)
CO2: 21 mmol/L — ABNORMAL LOW (ref 22–32)
Calcium: 9.9 mg/dL (ref 8.9–10.3)
Chloride: 106 mmol/L (ref 98–111)
Creatinine, Ser: 0.72 mg/dL (ref 0.44–1.00)
GFR, Estimated: >60 mL/min (ref >60)
Glucose, Bld: 143 mg/dL — ABNORMAL HIGH (ref 70–99)
Potassium: 3.8 mmol/L (ref 3.5–5.1)
Sodium: 139 mmol/L (ref 135–145)
Total Bilirubin: 1 mg/dL (ref 0.3–1.2)
Total Protein: 8 g/dL (ref 6.5–8.1)

## 2022-01-25 LAB — URINALYSIS, ROUTINE W REFLEX MICROSCOPIC
Bilirubin Urine: NEGATIVE
Glucose, UA: 50 mg/dL — AB
Hgb urine dipstick: NEGATIVE
Ketones, ur: 20 mg/dL — AB
Leukocytes,Ua: NEGATIVE
Nitrite: NEGATIVE
Protein, ur: 30 mg/dL — AB
Specific Gravity, Urine: 1.023 (ref 1.005–1.030)
pH: 9 — ABNORMAL HIGH (ref 5.0–8.0)

## 2022-01-25 LAB — TROPONIN I (HIGH SENSITIVITY)
Troponin I (High Sensitivity): 5 ng/L (ref <18)
Troponin I (High Sensitivity): 4 ng/L (ref <18)

## 2022-01-25 LAB — CBC
HCT: 43.4 % (ref 36.0–46.0)
Hemoglobin: 14.5 g/dL (ref 12.0–15.0)
MCH: 31.2 pg (ref 26.0–34.0)
MCHC: 33.4 g/dL (ref 30.0–36.0)
MCV: 93.3 fL (ref 80.0–100.0)
Platelets: 215 10*3/uL (ref 150–400)
RBC: 4.65 MIL/uL (ref 3.87–5.11)
RDW: 12.8 % (ref 11.5–15.5)
WBC: 8.1 10*3/uL (ref 4.0–10.5)
nRBC: 0 % (ref 0.0–0.2)

## 2022-01-25 LAB — LACTIC ACID, PLASMA
Lactic Acid, Venous: 3.4 mmol/L (ref 0.5–1.9)
Lactic Acid, Venous: 1.8 mmol/L (ref 0.5–1.9)

## 2022-01-25 LAB — LIPASE, BLOOD: Lipase: 31 U/L (ref 11–51)

## 2022-01-25 LAB — POC URINE PREG, ED: Preg Test, Ur: NEGATIVE

## 2022-01-25 MED ORDER — ONDANSETRON HCL 4 MG/2ML IJ SOLN
4.0000 mg | Freq: Once | INTRAMUSCULAR | Status: AC
  Administered 2022-01-25: 4 mg via INTRAVENOUS
  Filled 2022-01-25: qty 2

## 2022-01-25 MED ORDER — SODIUM CHLORIDE 0.9 % IV BOLUS
1000.0000 mL | Freq: Once | INTRAVENOUS | Status: AC
  Administered 2022-01-25: 1000 mL via INTRAVENOUS

## 2022-01-25 MED ORDER — LACTATED RINGERS IV BOLUS
1000.0000 mL | Freq: Once | INTRAVENOUS | Status: AC
  Administered 2022-01-25: 1000 mL via INTRAVENOUS

## 2022-01-25 MED ORDER — FENTANYL CITRATE PF 50 MCG/ML IJ SOSY
50.0000 ug | PREFILLED_SYRINGE | Freq: Once | INTRAMUSCULAR | Status: AC
  Administered 2022-01-25: 50 ug via INTRAVENOUS
  Filled 2022-01-25: qty 1

## 2022-01-25 NOTE — ED Provider Notes (Signed)
[facility] Provider Note    Event Date/Time   First MD Initiated Contact with Patient 01/25/22 1716     (approximate)   History   Emesis   HPI  Taylor Copeland is a 46 y.o. female who presents to the ED for evaluation of Emesis   I reviewed cardiology clinic visit from 5/15.  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   Patient presents to the ED with her husband for evaluation of emesis and diarrhea.  She reports feeling fine yesterday, but awakened this morning with significant nausea causing her to recurrently have diarrhea and throwing up.  Later developing upper abdominal cramping pain.   She has had multiple intra-abdominal surgeries with appendectomy, cholecystectomy , tubal ligation and ventral hernia repair.  Physical Exam   Triage Vital Signs: ED Triage Vitals [01/25/22 1608]  Enc Vitals Group     BP (!) 146/90     Pulse Rate 67     Resp (!) 23     Temp (!) 97.5 F (36.4 C)     Temp Source Oral     SpO2 98 %     Weight      Height      Head Circumference      Peak Flow      Pain Score 7     Pain Loc      Pain Edu?      Excl. in GC?     Most recent vital signs: Vitals:   01/25/22 1900 01/25/22 1930  BP: 126/80 128/82  Pulse: 93 96  Resp: 17 (!) 24  Temp:  98.1 F (36.7 C)  SpO2: 98% 99%    General: Awake, no distress.  Pleasant and conversational.  Well-appearing. CV:  Good peripheral perfusion.  Resp:  Normal effort.  Abd:  No distention.  Soft and benign throughout without peritoneal features or tenderness. MSK:  No deformity noted.  Neuro:  No focal deficits appreciated. Other:     ED Results / Procedures / Treatments   Labs (all labs ordered are listed, but only abnormal results are displayed) Labs Reviewed  COMPREHENSIVE METABOLIC PANEL - Abnormal; Notable for the following components:      Result Value   CO2 21 (*)    Glucose, Bld 143 (*)    Albumin 5.2 (*)    All  other components within normal limits  URINALYSIS, ROUTINE W REFLEX MICROSCOPIC - Abnormal; Notable for the following components:   Color, Urine YELLOW (*)    APPearance HAZY (*)    pH 9.0 (*)    Glucose, UA 50 (*)    Ketones, ur 20 (*)    Protein, ur 30 (*)    Bacteria, UA RARE (*)    All other components within normal limits  LACTIC ACID, PLASMA - Abnormal; Notable for the following components:   Lactic Acid, Venous 3.4 (*)    All other components within normal limits  LIPASE, BLOOD  CBC  LACTIC ACID, PLASMA  POC URINE PREG, ED  TROPONIN I (HIGH SENSITIVITY)  TROPONIN I (HIGH SENSITIVITY)    EKG Tremulous baseline, seems to demonstrate a sinus rhythm with rate of 63 bpm, normal axis and intervals. No clear signs of acute ischemia.   RADIOLOGY KUB interpreted by me without obstructive pathology  Official radiology report(s): DG Abdomen 1 View  Result Date: 01/25/2022 CLINICAL DATA:  Emesis, epigastric pain EXAM: ABDOMEN - 1 VIEW COMPARISON:  10/21/2021 FINDINGS: Nonobstructive bowel  gas pattern. No radio-opaque calculi or other significant radiographic abnormality are seen. IMPRESSION: Nonobstructive bowel gas pattern. Electronically Signed   By: Duanne Guess D.O.   On: 01/25/2022 18:37    PROCEDURES and INTERVENTIONS:  Procedures  Medications  ondansetron (ZOFRAN) injection 4 mg (4 mg Intravenous Given 01/25/22 1619)  fentaNYL (SUBLIMAZE) injection 50 mcg (50 mcg Intravenous Given 01/25/22 1620)  sodium chloride 0.9 % bolus 1,000 mL (0 mLs Intravenous Stopped 01/25/22 1932)  lactated ringers bolus 1,000 mL (0 mLs Intravenous Stopped 01/25/22 1932)     IMPRESSION / MDM / ASSESSMENT AND PLAN / ED COURSE  I reviewed the triage vital signs and the nursing notes.  Differential diagnosis includes, but is not limited to, gastroenteritis, dehydration, acute cystitis, SBO, pancreatitis  {Patient presents with symptoms of an acute illness or injury that is potentially  life-threatening.  46 year old female with multiple intra-abdominal surgeries presents with recurrent emesis and diarrhea, with stigmata of dehydration in the setting of likely gastroenteritis.  Looks systemically well and has a benign abdomen.  Considering her reassuring examination, despite her surgeries, I see no indication to CT her abdomen.  Plain film obtained and without obstructive pathology.  CBC and lipase are normal.  Metabolic panel with slight decrease in her bicarbonate, likely a degree of dehydration in the setting of her output.  Similarly, lactic acid is elevated and will be repeated after fluid resuscitation.  No signs of ACS.  She is tolerating p.o. intake and has resolution of symptoms after fluid resuscitation.  Work-up was most notable for signs of dehydration considering the ketones in her urine, lactic acidosis and decreased bicarbonate.  After receiving 2 L IVF she looks well.  Benign abdomen again on reassessment.  KUB without obstructive pathology.  Doubt more severe intra-abdominal pathology, suspect gastroenteritis.  We discussed return precautions and management at home.  Clinical Course as of 01/25/22 1939  Caleen Essex Jan 25, 2022  1923 Reassessed.  Feeling well. [DS]    Clinical Course User Index [DS] Delton Prairie, MD     FINAL CLINICAL IMPRESSION(S) / ED DIAGNOSES   Final diagnoses:  Nausea vomiting and diarrhea  Dehydration  Gastroenteritis     Rx / DC Orders   ED Discharge Orders     None        Note:  This document was prepared using Dragon voice recognition software and may include unintentional dictation errors.   Delton Prairie, MD 01/25/22 [phone]

## 2022-01-25 NOTE — ED Notes (Addendum)
Pt to ED for emesis and epigastric pain, that started this morning. Pt states she has had her appendix and gallbladder taken out hernia repair. Pt has had multiple episodes of vomiting and diarrhea.   Pt denies any fevers.  Pt is A&Ox4.

## 2022-01-25 NOTE — ED Notes (Signed)
Pt verbalized understanding of discharge instructions and follow-up care instructions. Pt advised if symptoms worsen to return to ED.  

## 2022-01-25 NOTE — ED Triage Notes (Signed)
Pt states she doesn't have gallbladder and she had recurrent N/V bouts. Pt states she took zofran w/ out relief. Pt reports she hasn't been able to take heart medication due to vomiting. Pt hyperventilating and dry heaving.

## 2022-01-25 NOTE — ED Provider Triage Note (Signed)
Emergency Medicine Provider Triage Evaluation Note  Taylor Copeland , a 46 y.o. female  was evaluated in triage.  Pt complains of epigastric abdominal pain with nausea and vomiting that started early this morning. Since having her gallbladder removed last year, she has bouts of this. Last time it was diagnosed as enteritis. No relief with zofran.   Physical Exam  There were no vitals taken for this visit. Gen:   Awake, no distress   Resp:  Normal effort  MSK:   Moves extremities without difficulty  Other:    Medical Decision Making  Medically screening exam initiated at 4:07 PM.  Appropriate orders placed.  SAMYUKTA CURA was informed that the remainder of the evaluation will be completed by another provider, this initial triage assessment does not replace that evaluation, and the importance of remaining in the ED until their evaluation is complete.    Chinita Pester, FNP 01/25/22 1609

## 2022-02-12 ENCOUNTER — Inpatient Hospital Stay
Admission: EM | Admit: 2022-02-12 | Discharge: 2022-02-12 | Disposition: A | Discharge status: Home / Self Care | Payer: MEDICARE | Admitting: Emergency Medicine

## 2022-02-12 DIAGNOSIS — K529 Noninfective gastroenteritis and colitis, unspecified: Secondary | ICD-10-CM

## 2022-02-12 DIAGNOSIS — R1013 Epigastric pain: Secondary | ICD-10-CM

## 2022-02-12 LAB — URINE DRUG SCREEN
Amphetamines screen, urine: NOT DETECTED
Barbiturates screen, urine: NOT DETECTED
Benzodiazepines screen, urine: NOT DETECTED
Buprenorphine screen, urine: NOT DETECTED
Cannabinoids screen, urine: DETECTED — AB
Cocaine screen, urine: NOT DETECTED
Creatinine, urine, specimen validity: 91.3 mg/dL (ref >=20.00)
Ethanol screen, urine: NOT DETECTED
Fentanyl screen, urine: NOT DETECTED
Methadone screen, urine: NOT DETECTED
Opiates screen, urine: NOT DETECTED
Oxycodone screen, urine: NOT DETECTED

## 2022-02-12 LAB — COMPREHENSIVE METABOLIC PANEL
ALT: 14 U/L (ref 0–55)
AST: 23 U/L (ref 6–42)
Albumin: 4.8 g/dL (ref 3.2–5.0)
Alkaline phosphatase: 64 U/L (ref 30–130)
Anion Gap: 13 mmol/L (ref 3–14)
BUN: 25 mg/dL — ABNORMAL HIGH (ref 6–24)
Bilirubin, total: 0.4 mg/dL (ref 0.2–1.2)
CO2 (Bicarbonate): 25 mmol/L (ref 20–32)
Calcium: 9.9 mg/dL (ref 8.5–10.5)
Chloride: 103 mmol/L (ref 98–110)
Creatinine: 0.85 mg/dL (ref 0.55–1.30)
Glucose: 146 mg/dL — ABNORMAL HIGH (ref 70–139)
Potassium: 4.4 mmol/L (ref 3.6–5.2)
Protein, total: 7.5 g/dL (ref 6.0–8.4)
Sodium: 141 mmol/L (ref 135–146)
eGFRcr: 85 mL/min/{1.73_m2} (ref >=60)

## 2022-02-12 LAB — CBC WITH DIFFERENTIAL
Basophils %: 0.5 %
Basophils Absolute: 0.06 10*3/uL (ref 0.00–0.22)
Eosinophils %: 1.9 %
Eosinophils Absolute: 0.21 10*3/uL (ref 0.00–0.50)
Hematocrit: 43.6 % (ref 32.0–47.0)
Hemoglobin: 15.4 g/dL (ref 11.0–16.0)
Immature Granulocytes %: 0.5 %
Immature Granulocytes Absolute: 0.06 10*3/uL (ref 0.00–0.10)
Lymphocyte %: 15.6 %
Lymphocytes Absolute: 1.76 10*3/uL (ref 0.70–4.00)
MCH: 31.8 pg (ref 26.0–34.0)
MCHC: 35.3 g/dL (ref 31.0–37.0)
MCV: 90.1 fL (ref 80.0–100.0)
MPV: 10.1 fL (ref 9.1–12.4)
Monocytes %: 6.4 %
Monocytes Absolute: 0.72 10*3/uL (ref 0.36–0.77)
NRBC %: 0 % (ref 0.0–0.0)
NRBC Absolute: 0 10*3/uL (ref 0.00–2.00)
Neutrophil %: 75.1 %
Neutrophils Absolute: 8.45 10*3/uL — ABNORMAL HIGH (ref 1.50–7.95)
Platelets: 218 10*3/uL (ref 150–400)
RBC: 4.84 M/uL (ref 3.70–5.20)
RDW-CV: 12 % (ref 11.5–14.5)
RDW-SD: 39.8 fL (ref 35.0–51.0)
WBC: 11.3 10*3/uL — ABNORMAL HIGH (ref 4.0–11.0)

## 2022-02-12 LAB — HCG, QUANTITATIVE, PREGNANCY: hCG, serum, quantitative: 2 m[IU]/mL (ref <=5)

## 2022-02-12 LAB — SDS, SERUM DRUG SCREEN
Acetaminophen level: <5 ug/mL (ref <=30)
Benzodiazepines screen, serum: NOT DETECTED
Ethanol level, plasma/serum: <10 mg/dL (ref <=10)
Salicylate level: <0.5 mg/dL (ref 0.0–30.0)
Tricyclics screen, serum: NOT DETECTED mg/dL

## 2022-02-12 LAB — LIGHT BLUE TOP

## 2022-02-12 LAB — LAVENDER TOP

## 2022-02-12 LAB — RAINBOW DRAW LT. GREEN PST TOP

## 2022-02-12 LAB — TROPONIN T, HIGH SENSITIVITY
Troponin T, High Sensitivity: 6 ng/L (ref <12)
Troponin T, High Sensitivity: <6 ng/L (ref <12)

## 2022-02-12 LAB — GRAY TOP

## 2022-02-12 MED ORDER — ondansetron ODT (Zofran-ODT) 4 mg disintegrating tablet
4 | ORAL_TABLET | Freq: Three times a day (TID) | ORAL | 0 refills | Status: AC | PRN
Start: 2022-02-12 — End: 2022-02-19
  Filled 2022-02-12: qty 10, 4d supply, fill #0

## 2022-02-12 MED ORDER — sodium chloride 0.9 % bolus 1,000 mL
0.9 | Freq: Once | INTRAVENOUS | Status: AC
Start: 2022-02-12 — End: 2022-02-12
  Administered 2022-02-12: 13:00:00 1000 mL via INTRAVENOUS

## 2022-02-12 MED ORDER — ondansetron (Zofran) injection 4 mg
4 | Freq: Once | INTRAMUSCULAR | Status: AC
Start: 2022-02-12 — End: 2022-02-12
  Administered 2022-02-12: 16:00:00 4 mg via INTRAVENOUS

## 2022-02-12 MED ORDER — sodium chloride 0.9 % infusion
0.9 | INTRAVENOUS | Status: DC
Start: 2022-02-12 — End: 2022-02-12
  Administered 2022-02-12: 16:00:00 200 mL/h via INTRAVENOUS

## 2022-02-12 MED ORDER — droperidol (Inapsine) injection 0.625 mg
2.5 | Freq: Once | INTRAMUSCULAR | Status: AC
Start: 2022-02-12 — End: 2022-02-12
  Administered 2022-02-12: 13:00:00 0.625 mg via INTRAVENOUS

## 2022-02-12 MED ORDER — pantoprazole (ProtoNix) injection 40 mg
40 | Freq: Once | INTRAVENOUS | Status: AC
Start: 2022-02-12 — End: 2022-02-12
  Administered 2022-02-12: 16:00:00 40 mg via INTRAVENOUS

## 2022-02-12 MED FILL — DROPERIDOL 2.5 MG/ML INJECTION SOLUTION: 2.5 2.5 mg/mL | INTRAMUSCULAR | Qty: 2

## 2022-02-12 MED FILL — PANTOPRAZOLE 40 MG INTRAVENOUS SOLUTION: 40 40 mg | INTRAVENOUS | Qty: 40

## 2022-02-12 MED FILL — ONDANSETRON HCL (PF) 4 MG/2 ML INJECTION SOLUTION: 4 4 mg/2 mL | INTRAMUSCULAR | Qty: 2

## 2022-02-12 NOTE — Other (Signed)
Patient Education   Table of Contents     Abdominal Pain, Adult     To view videos and all your education online visit,   [email or web address].elsevier.com/[email or web address]   or scan this QR code with your smartphone.   Access to this content will expire in one year.                    Abdominal Pain, Adult     Many things can cause belly (abdominal) pain. Most times, belly pain is not dangerous. Many cases of belly pain can be watched and treated at home. Sometimes, though, belly pain is serious. Your doctor will try to find the cause of your belly pain.   Follow these instructions at home:      Medicines     Take over-the-counter and prescription medicines only as told by your doctor.    Do not  take medicines that help you poop (laxatives) unless told by your doctor.     General instructions     Watch your belly pain for any changes.     Drink enough fluid to keep your pee (urine) pale yellow.     Keep all follow-up visits as told by your doctor. This is important.       Contact a doctor if:       Your belly pain changes or gets worse.     You are not hungry, or you lose weight without trying.     You are having trouble pooping (constipated) or have watery poop (diarrhea) for more than 2?3 days.     You have pain when you pee or poop.     Your belly pain wakes you up at night.     Your pain gets worse with meals, after eating, or with certain foods.     You are vomiting and cannot keep anything down.     You have a fever.     You have blood in your pee.     Get help right away if:       Your pain does not go away as soon as your doctor says it should.     You cannot stop vomiting.     Your pain is only in areas of your belly, such as the right side or the left lower part of the belly.     You have bloody or black poop, or poop that looks like tar.     You have very bad pain, cramping, or bloating in your belly.     You have signs of not having enough fluid or water in your body (dehydration), such as:     Dark pee, very little pee,  or no pee.     Cracked lips.     Dry mouth.     Sunken eyes.     Sleepiness.     Weakness.     You have trouble breathing or chest pain.     Summary       Many cases of belly pain can be watched and treated at home.     Watch your belly pain for any changes.     Take over-the-counter and prescription medicines only as told by your doctor.     Contact a doctor if your belly pain changes or gets worse.     Get help right away if you have very bad pain, cramping, or bloating in your belly.     This information is not intended  to replace advice given to you by your health care provider. Make sure you discuss any questions you have with your health care provider.     Document Released: 01/01/2008 Document Revised: 11/23/2018 Document Reviewed: 11/23/2018     Elsevier Patient Education ? [employer].

## 2022-02-12 NOTE — Unmapped (Signed)
Patient abdomen was reexamined no guarding no rebounding.  Discussed patient's labs.  Her vomiting is markedly improved.  She still states she feels moderately nauseated.  Continue to hydrate.  Patient requesting Zofran will initiate Zofran.  Also Protonix as she states that she has reflux type symptoms.  Troponin #2 will also be sent     Sibyl ParrMichael S. British Moyd, MD  02/12/22 1154

## 2022-02-12 NOTE — ED Triage Notes (Signed)
0600 started with abdominal pain and n/v. Pt also with some chest pain r/t vomiting. Pt diagnosed with gastritis 2 weeks ago when she had similar episode. Denies fevers. Epigastric abdominal pain. No vomiting per EMS. Pt arrives groaning. Pt is also a daily marijuana smoker.

## 2022-02-12 NOTE — Discharge Instructions (Addendum)
Follow gastroenteritis instructions.  You should stop using marijuana as this may also contribute to your vomiting illness.  You were observed in the emergency department and had 2 negative cardiac enzymes.  Follow-up with your GI doctor when you return home.  Good handwashing until your diarrhea has resolved.

## 2022-02-12 NOTE — Other (Signed)
Patient Education   Table of Contents     Cannabinoid Hyperemesis Syndrome     To view videos and all your education online visit,   [email or web address]   or scan this QR code with your smartphone.   Access to this content will expire in one year.                    Cannabinoid Hyperemesis Syndrome     Cannabinoid hyperemesis syndrome (CHS) is a condition that causes repeated nausea, vomiting, and abdominal pain after long-term use of marijuana (cannabis). People with CHS typically use marijuana 3?5 times a day for many years before they have symptoms, although it is possible to develop CHS with far less daily use.   Symptoms of CHS may be mild at first but can get worse and more frequent. In some cases, CHS may cause severe daily vomiting, which can lead to weight loss and dehydration.   What are the causes?   The exact cause of CHS is not known. Long-term use of marijuana may overstimulate certain proteins in the brain and digestive tract that react with chemicals in marijuana (cannabinoid receptors). This overstimulation may cause CHS.   What are the signs or symptoms?    Symptoms of CHS are often mild during the first few episodes, but they can get worse over time. Symptoms may include:     Frequent nausea, especially early in the morning.     Vomiting. This can become severe.     Abdominal pain.     Feeling very tired (lethargic).     Headaches.     CHS may go away and come back many times (recur). People may not have symptoms or may otherwise be healthy in between [facility] episodes. Taking hot showers can relieve the symptoms of CHS, so feeling the need to take several hot showers throughout the day can be a sign of this condition.   How is this diagnosed?    CHS may be diagnosed based on:     Your symptoms and medical history, including any drug use.     A physical exam.      You may have tests done to rule out other problems that could cause your symptoms. These tests may include:     Blood tests.      Urine tests.     Imaging tests, such as an X-ray or a CT scan.     How is this treated?    Treatment for this condition involves stopping marijuana use. Treatment may include:     A drug rehab program, if you have trouble stopping marijuana use.     Medicines for nausea. These may be given at the hospital through an IV inserted into one of your veins, or they may be medicines that you take by mouth (orally).     Certain creams that contain a substance called capsaicin. These may improve symptoms when applied to the abdomen.     Hot showers to help relieve symptoms.     In severe cases, you may need treatment at a hospital. You may be given IV fluids to prevent or treat dehydration as well as medicines to treat nausea, vomiting, and pain.   Follow these instructions at home:   During an episode of CHS        Stay in bed and rest in a dark, quiet room.     Take anti-nausea medicine as told by your health care provider.  Try taking hot showers to relieve your symptoms.     After an episode of CHS     Drink small amounts of clear fluids. Slowly add more if you can keep the fluids down without vomiting.     Once you are able to eat without vomiting, eat soft foods in small amounts every 3?4 hours.     General instructions    Do not  use any products that contain marijuana.If you need help quitting, ask your health care provider for resources and treatment options.     Drink enough fluid to keep your urine pale yellow. Avoid drinking fluids that have a lot of sugar or caffeine, such as coffee and soda.     Take and apply over-the-counter and prescription medicines only as told by your health care provider. Ask your health care provider before starting any new medicines or treatments.     Keep all follow-up visits. This includes any recommended programs for substance use disorders.       Contact a health care provider if:       Your symptoms get worse.     You cannot drink fluids without vomiting or severe pain.     You  have pain and trouble swallowing after an episode.     Get help right away if:       You cannot stop vomiting.     You have blood in your vomit or your vomit looks like coffee grounds.     You have severe abdominal pain.     You have stools that are bloody or black, or stools that look like tar.     You have symptoms of dehydration, such as:     Sunken eyes.     Inability to make tears.     Cracked lips or dry mouth.     Decreased urine production.     Weakness.     Sleepiness.     Dizziness, light-headedness, or fainting.     These symptoms may be an emergency. Get help right away. Call 911.    Do not wait to see if the symptoms will go away.    Do not drive yourself to the hospital.     Summary       Cannabinoid hyperemesis syndrome (CHS) is a condition that causes repeated nausea, vomiting, and abdominal pain after long-term use of marijuana.     Treatment for this condition involves stopping marijuana use. Hot showers and capsaicin creams may also help relieve symptoms.     Your health care provider may prescribe medicines to help with nausea.     Ask your health care provider before starting any medicines or other treatments.     This information is not intended to replace advice given to you by your health care provider. Make sure you discuss any questions you have with your health care provider.     Document Released: 10/23/2016 Document Revised: 11/12/2021 Document Reviewed: 11/12/2021     Elsevier Patient Education ? 2023 [employer]

## 2022-02-12 NOTE — ED Provider Notes (Signed)
EMERGENCY DEPARTMENT ENCOUNTER    ATTENDING NOTE    Date of service: 02/12/2022  8:22 AM    Chief Complaint   Patient presents with   . Abdominal Pain   . Nausea And Vomiting       Nursing notes reviewed and agreed with unless modified below.    HISTORY OF PRESENT ILLNESS:    Emily Yang is a 46 y.o. female who  has no past medical history on file. who presents with nausea vomiting and diarrhea onset the past few days.  The patient describes that she has a past history of neurocardiogenic syncope however her mother and the rest of her family are traveling here.  They are all staying together.  She states her mother had a recent GI illness with diarrhea.  The patient now states that she has had multiple episodes of vomiting and diarrhea greater than 10 total.  There has been no hematemesis no melanotic stool.  She also does state that she has had acid the reflux type symptoms which goes up towards her chest.  She describes that she has had a work-up by her doctor for similar GI symptoms however has not seen a gastroenterologist.  I reviewed the patient's recent CT scan from October 21, 2021 from the patient's phone which showed small intestinal enteritis.  She also has been seen recently past 3 weeks for similar.  She reports that she has used a couple of bowls of marijuana yesterday.  She states that she is a regular marijuana smoker.  REVIEW OF SYSTEMS  Presently denies any chest pain there is positive severe nausea positive severe vomiting positive diarrhea crampy nonlocalized abdominal pain all other review of systems is negative  ROS   PAST MEDICAL HISTORY / PROBLEM LIST    No past medical history on file.    Patient Active Problem List   Diagnosis   . Vomiting       PAST SURGICAL HISTORY    No past surgical history on file.    MEDICATIONS     Discharge Medication List as of 02/12/2022  2:03 PM           ALLERGIES    No Known Allergies    SOCIAL HISTORY  Smokes marijuana positive recent travel  Social  History     Tobacco Use   . Smoking status: Not on file   . Smokeless tobacco: Not on file   Substance Use Topics   . Alcohol use: Not on file     Social History     Substance and Sexual Activity   Drug Use Not on file       FAMILY HISTORY  Patient's mother also ill with same  No family history on file.    PHYSICAL EXAM    ED Triage Vitals   Temp Pulse Resp BP   02/12/22 0819 02/12/22 0819 02/12/22 0819 02/12/22 0819   36.3 C (97.4 F) 72 18 122/74      SpO2 Temp Source Heart Rate Source Patient Position   02/12/22 0819 02/12/22 0819 02/12/22 0819 02/12/22 0819   99 % Temporal Monitor Lying      BP Location FiO2 (%)     02/12/22 1413 --     Left arm           General: Well-appearing. NAD.  Head: Atraumatic  Eyes: PERRL, clear conjunctivae.  ENT: Slightly dry oral mucosa. Normal posterior OP. Neck: Supple, trachea midline, no cervical LAD.   Respiratory: No  respiratory distress, clear lungs  CVS: RRR, normal S1 and S2, no murmurs, no chest wall tenderness.   Abdomen: Soft, non-tender, non-distended.  Back: No MLT, no CVAT.  Extremities: LE symmetric, no edema.  Skin: Normal temperature, brisk capillary refill, no rash.  Neuro: Alert and oriented x3, no focal deficits    EKG    EKG interpreted by me and shows:    Sinus rhythm 67 no acute ST changes.  Appears similar to old EKG which was on the patient's phone    Cardiac Monitor    Rhythm strip interpreted by me and shows:    Sinus rhythm 70 no ectopy    LABS    Labs Reviewed   COMPREHENSIVE METABOLIC PANEL - Abnormal       Result Value    Sodium 141      Potassium 4.4      Chloride 103      CO2 (Bicarbonate) 25      Anion Gap 13      BUN 25 (*)     Creatinine 0.85      eGFRcr 85      Glucose 146 (*)     Fasting? Unknown      Calcium 9.9      AST 23      ALT 14      Alkaline phosphatase 64      Protein, total 7.5      Albumin 4.8      Bilirubin, total 0.4     URINE DRUG SCREEN - Abnormal    Amphetamines screen, urine Not Detected      Barbiturates screen, urine Not  Detected      Benzodiazepines screen, urine Not Detected      Buprenorphine screen, urine Not Detected      Cannabinoids screen, urine Detected (*)     Cocaine screen, urine Not Detected      Ethanol screen, urine Not Detected      Fentanyl screen, urine Not Detected      Methadone screen, urine Not Detected      Opiates screen, urine Not Detected      Oxycodone screen, urine Not Detected      Creatinine, urine, specimen validity 91.30      Narrative:     These are unconfirmed screening results and should be used only for medical purposes. If the clinical setting requires confirmation, contact the clinical laboratory.     CBC WITH DIFFERENTIAL - Abnormal    WBC 11.3 (*)     RBC 4.84      Hemoglobin 15.4      Hematocrit 43.6      MCV 90.1      MCH 31.8      MCHC 35.3      RDW-CV 12.0      RDW-SD 39.8      Platelets 218      MPV 10.1      Neutrophil % 75.1      Lymphocyte % 15.6      Monocytes % 6.4      Eosinophils % 1.9      Basophils % 0.5      Immature Granulocytes % 0.5      NRBC % 0.0      Neutrophils Absolute 8.45 (*)     Lymphocytes Absolute 1.76      Monocytes Absolute 0.72      Eosinophils Absolute 0.21      Basophils Absolute 0.06  Immature Granulocytes Absolute 0.06      NRBC Absolute 0.00     SDS, SERUM DRUG SCREEN - Normal    Acetaminophen level <5      Ethanol level, plasma/serum <10      Salicylate level <0.5      Benzodiazepines screen, serum Not Detected      Tricyclics screen, serum Not Detected     HCG, QUANTITATIVE, PREGNANCY - Normal    hCG, serum, quantitative 2      Narrative:     Expected Results:  Adult Female: 0-1 mIU/mL  Non-pregnant Female: 0-4 mIU/mL    If pregnancy is expected, suggest repeat in 24-72 hours.    The concentration of HCG rises rapidly during early pregnancy to a level of 5,000 to 200,000 mIU/mL at 10-12 weeks, followed by a slow decline to levels of 1,000 to 50,000 mIU/mL during the 3rd trimester.    The use of this assay to monitor or diagnose patients with cancer or  any other condiion unrelated to pregnancy has not been cleared or approved by the FDA or the manufacturer of the assay.   TROPONIN T, HIGH SENSITIVITY - Normal    Troponin T, High Sensitivity 6     TROPONIN T, HIGH SENSITIVITY - Normal    Troponin T, High Sensitivity <6     CBC W/DIFF    Narrative:     The following orders were created for panel order CBC and differential.  Procedure                               Abnormality         Status                     ---------                               -----------         ------                     CBC w/ Differential[[id number]]          Abnormal            Final result                 Please view results for these tests on the individual orders.        RADIOLOGY    No orders to display       MEDICAL DECISION MAKING & ED COURSE  Differential diagnosis considered acute gastroenteritis GI reflux unlikely acute coronary syndrome peptic ulcer disease, hyperemesis related to cannabinoid syndrome.  Patient describes smoking marijuana x2 yesterday.  Has negative abdominal exam was examined serially.  Her heart score is 2 secondary to hyperlipidemia and she also states that she has a history of neurocardiogenic syncope.  She was observed felt improved with hydration droperidol.  Abdominal exam remained negative patient will be discharged with Zofran and ODT.  Abdominal exam negative tolerating p.o. ginger ale at prior discharge.  High-sensitivity troponin negative x2  Pertinent labs and imaging studies reviewed.    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 Epigastric pain    2. 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 Gastroenteritis and colitis, viral             Sibyl Parr, MD  02/12/22 [phone]

## 2022-02-12 NOTE — Unmapped (Signed)
ED Observation Discharge Summary:     Clinical Course: Patient with history of marijuana use but also a mother who has gastroenteritis.  Has negative abdominal exam cardiac enzymes negative x2.  Does not have chest pain but nausea vomiting and diarrhea.  Mild leukocytosis secondary to vomiting.    Physical Exam alert in no distress oropharynx minimally dry abdomen negative lungs clear back no CVA tenderness    Diagnosis: Acute gastroenteritis, hyperemesis cannabinoid syndrome    Disposition: Discharge     Sibyl Parr, MD  02/12/22 1635

## 2022-04-19 ENCOUNTER — Other Ambulatory Visit: Payer: Self-pay | Admitting: Internal Medicine

## 2022-04-19 DIAGNOSIS — Z1231 Encounter for screening mammogram for malignant neoplasm of breast: Secondary | ICD-10-CM

## 2022-04-29 IMAGING — CT CT ABD-PELV W/ CM
2 of 5 series · 16 of 46 positions shown, 18 images · IV contrast (APPLIED)
Comparison: CT the abdomen and pelvis 12/29/2020.

CLINICAL DATA: 45-year-old female with history of nausea and
vomiting. Suspected bowel obstruction.

EXAM:
CT ABDOMEN AND PELVIS WITH CONTRAST
TECHNIQUE: Multidetector CT imaging of the abdomen and pelvis was performed
using the standard protocol following bolus administration of
intravenous contrast.

[Series 2: routine abd/pel with · axial · 0.81mm/px · z∈[-1079,-674]mm · 13 of 93 slices shown, 15 images]
[im 6/93  soft-tissue]
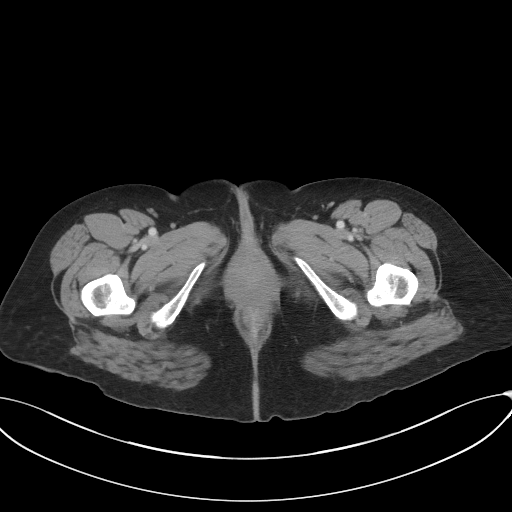
[im 6/93  bone]
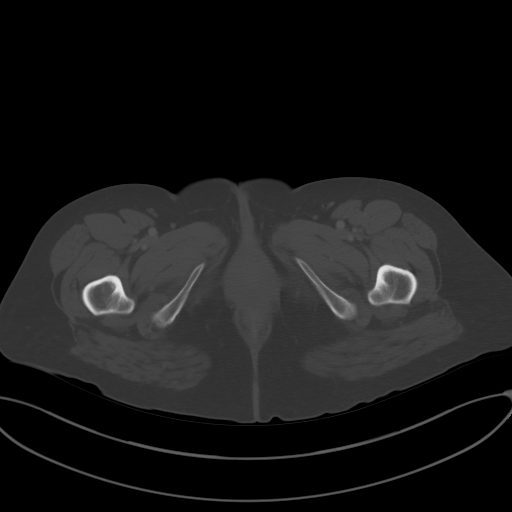
[im 11/93  soft-tissue]
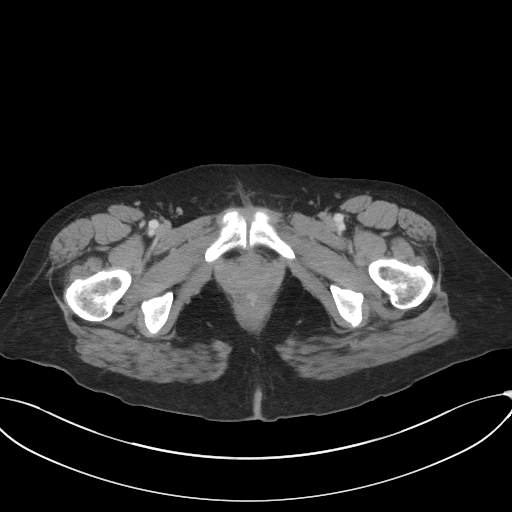
[im 21/93  soft-tissue]
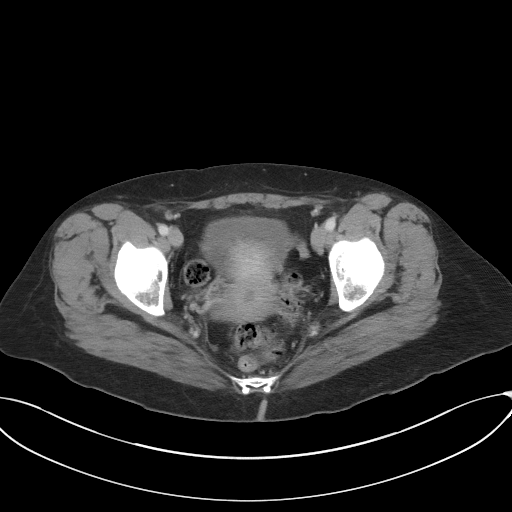
[im 26/93  soft-tissue]
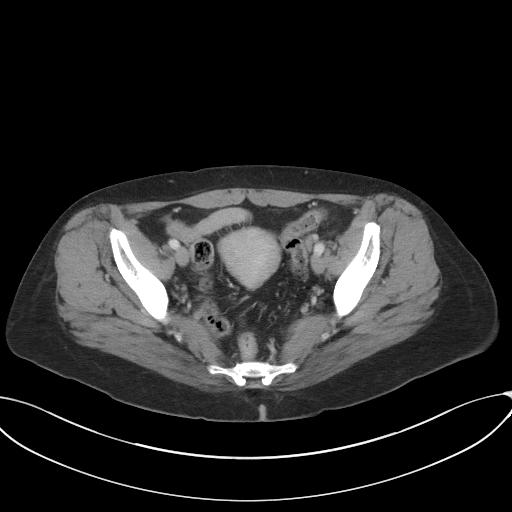
[im 31/93  soft-tissue]
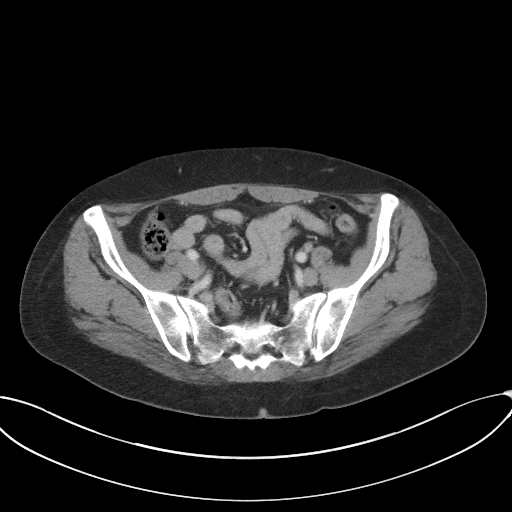
[im 41/93  soft-tissue]
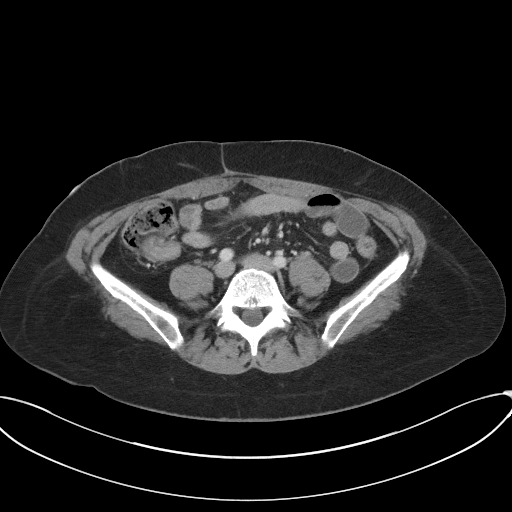
[im 47/93  soft-tissue]
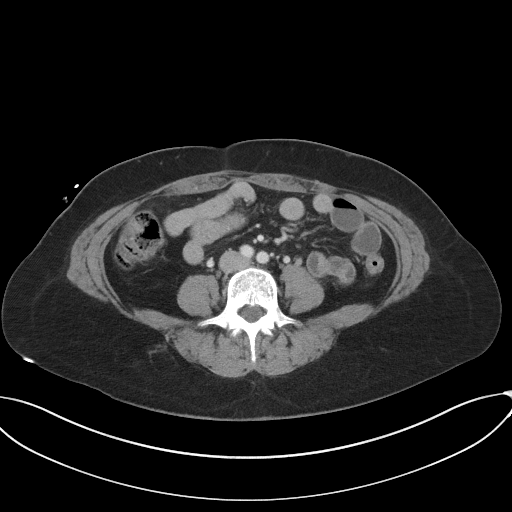
[im 52/93  soft-tissue]
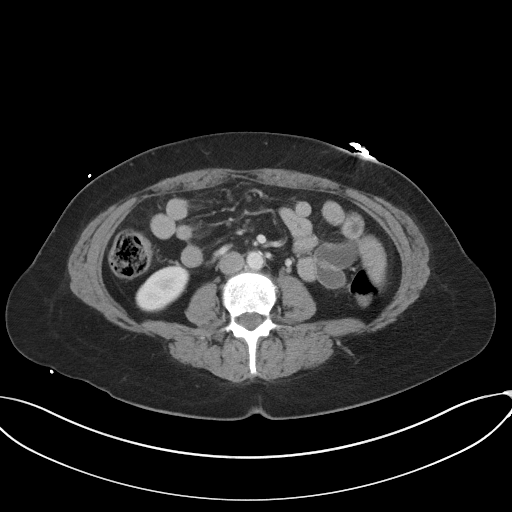
[im 62/93  soft-tissue]
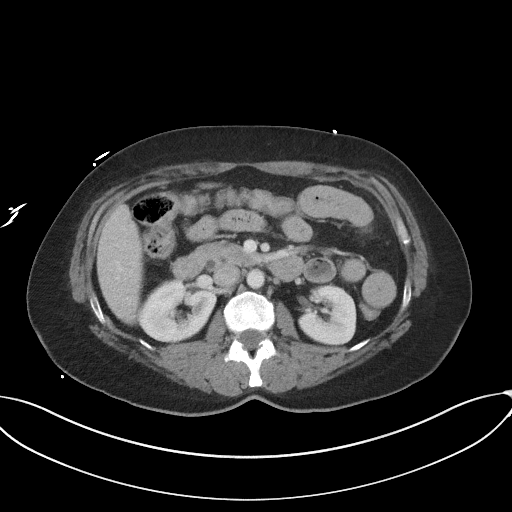
[im 62/93  bone]
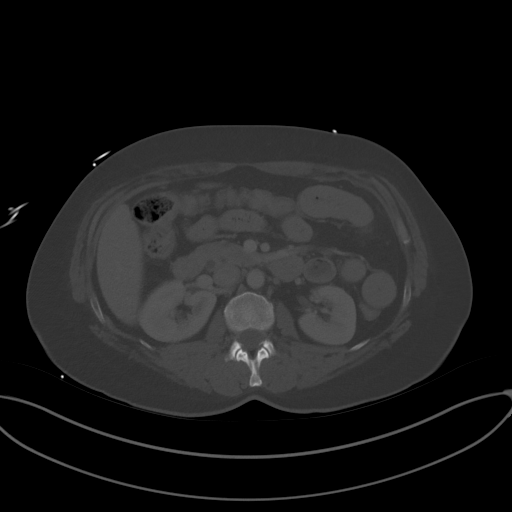
[im 67/93  soft-tissue]
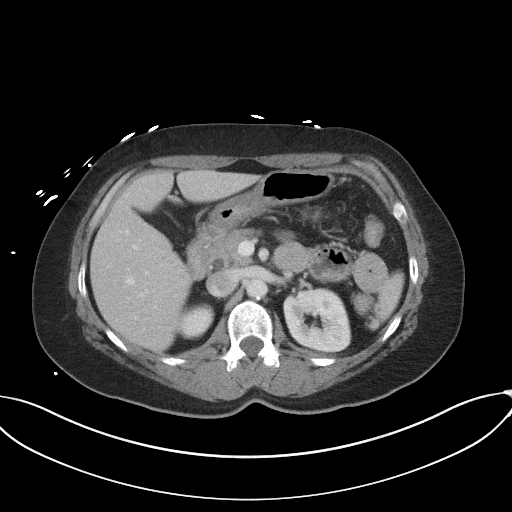
[im 72/93  soft-tissue]
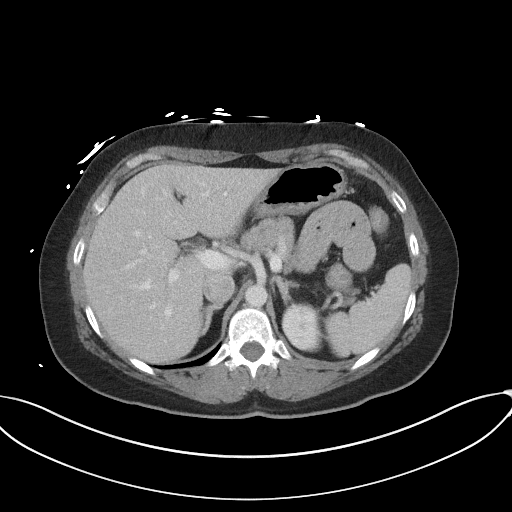
[im 82/93  soft-tissue]
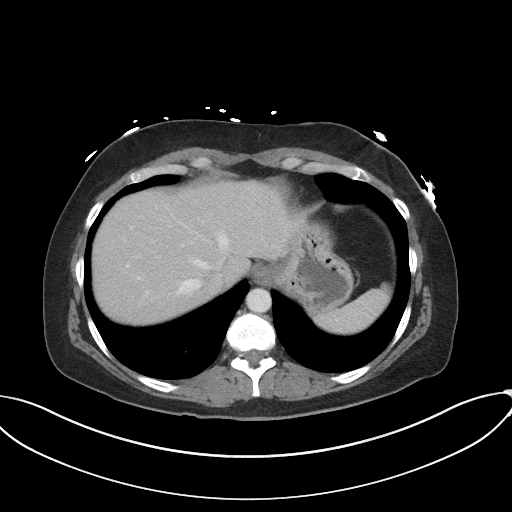
[im 87/93  soft-tissue]
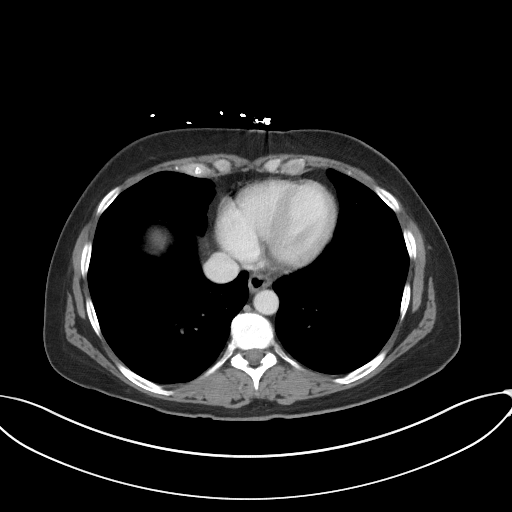

[Series 5: coronal st · coronal · 0.80mm/px · 3 of 85 slices shown]
[im 29/85  soft-tissue]
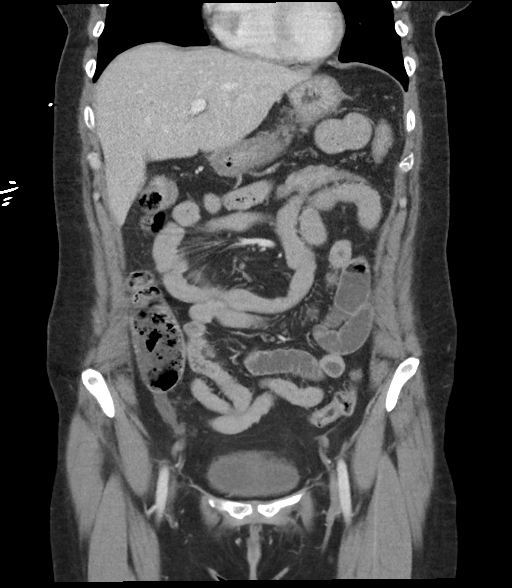
[im 38/85  soft-tissue]
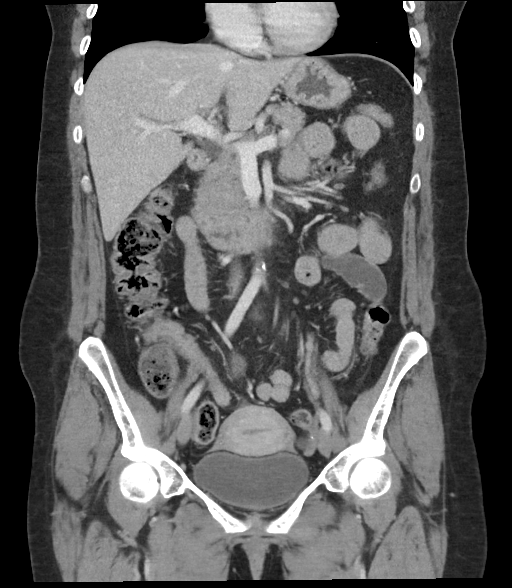
[im 47/85  soft-tissue]
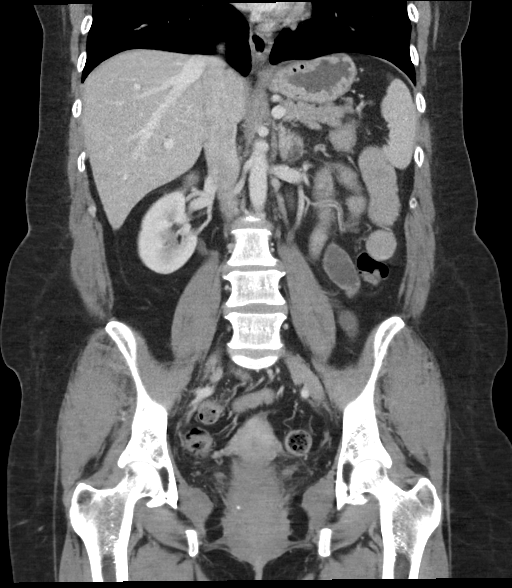

[16 of 46 positions shown; findings below may reference images not displayed]

RADIATION DOSE REDUCTION: This exam was performed according to the
departmental dose-optimization program which includes automated
exposure control, adjustment of the mA and/or kV according to
patient size and/or use of iterative reconstruction technique.

CONTRAST:  100mL OMNIPAQUE IOHEXOL 300 MG/ML  SOLN
FINDINGS: Lower chest: Bilateral breast implants are incidentally noted.

Hepatobiliary: No suspicious cystic or solid hepatic lesions. No
intra or extrahepatic biliary ductal dilatation. Status post
cholecystectomy.

Pancreas: No pancreatic mass. No pancreatic ductal dilatation. No
pancreatic or peripancreatic fluid collections or inflammatory
changes.

Spleen: Unremarkable.

Adrenals/Urinary Tract: Subcentimeter low-attenuation lesion in the
upper pole of the left kidney, too small to characterize, but
statistically likely a small cyst. Right kidney and bilateral
adrenal glands are normal in appearance. No hydroureteronephrosis.
Urinary bladder is normal in appearance.

Stomach/Bowel: Normal appearance of the stomach. No pathologic
dilatation of small bowel or colon. However, there are areas of
mural thickening in the jejunum, with slight hypervascularity in the
adjacent mesentery, suggesting enteritis. Status post appendectomy.

Vascular/Lymphatic: Aortic atherosclerosis. No aneurysm or
dissection noted in the abdominal or pelvic vasculature. No
lymphadenopathy noted in the abdomen or pelvis.

Reproductive: Uterus and ovaries are unremarkable in appearance.

Other: No significant volume of ascites.  No pneumoperitoneum.

Musculoskeletal: There are no aggressive appearing lytic or blastic
lesions noted in the visualized portions of the skeleton.
IMPRESSION: 1. Areas of mural thickening throughout the jejunum concerning for
small bowel enteritis.
2. No other acute findings elsewhere in the abdomen or pelvis to
account for the patient's symptoms.
3. Aortic atherosclerosis.

## 2022-05-13 ENCOUNTER — Ambulatory Visit
Admission: RE | Admit: 2022-05-13 | Discharge: 2022-05-13 | Disposition: A | Discharge status: Home / Self Care | Payer: Medicare Other | Source: Ambulatory Visit | Attending: Internal Medicine | Admitting: Internal Medicine

## 2022-05-13 DIAGNOSIS — Z1231 Encounter for screening mammogram for malignant neoplasm of breast: Secondary | ICD-10-CM | POA: Insufficient documentation

## 2023-08-01 ENCOUNTER — Encounter: Payer: Self-pay | Admitting: Surgery

## 2023-08-01 ENCOUNTER — Ambulatory Visit (INDEPENDENT_AMBULATORY_CARE_PROVIDER_SITE_OTHER): Payer: Medicare Other | Admitting: Surgery

## 2023-08-01 VITALS — BP 161/104 | HR 63 | Temp 98.6°F | Ht 62.75 in | Wt 163.2 lb

## 2023-08-01 DIAGNOSIS — G8929 Other chronic pain: Secondary | ICD-10-CM

## 2023-08-01 DIAGNOSIS — R1013 Epigastric pain: Secondary | ICD-10-CM

## 2023-08-01 NOTE — Progress Notes (Addendum)
 08/01/2023  History of Present Illness: Taylor Copeland is a 48 y.o. female presenting for follow-up of chronic abdominal pain.  Patient is known to our service and has had a robotic incarcerated ventral hernia repair in 2021 and a robotic assisted cholecystectomy for biliary dyskinesia in 2022 with me.  The patient reports that she continues to have epigastric abdominal pain.  Over the last year she has been seen by GI and had colonoscopy and EGD in 07/2022.  I am not able to see any information on those, but per other notes, she had a small ulcer on EGD.    She reports her pain is located in the epigastric region.  Sometimes it causes nausea.  The pain happens when she's bending forward and also when she's lifting something heavy.  Denies any bulging sensation.  No recent imaging.  Of note, her blood pressure today is elevated at 161/104.  Past Medical History: Past Medical History:  Diagnosis Date   Asthma    Syncope    neurocardiogenic syncope   Syncope      Past Surgical History: Past Surgical History:  Procedure Laterality Date   APPENDECTOMY     AUGMENTATION MAMMAPLASTY Bilateral    20 + years ago   CHOLECYSTECTOMY     LAPAROSCOPIC APPENDECTOMY  08/01/2013   PLACEMENT OF BREAST IMPLANTS     TUBAL LIGATION     tummy tuck     XI ROBOTIC ASSISTED VENTRAL HERNIA N/A 02/11/2020   incarcerated omentum in umbilical incisional hernia    Home Medications: Prior to Admission medications   Medication Sig Start Date End Date Taking? Authorizing Provider  acebutolol  (SECTRAL ) 200 MG capsule Take 200 mg by mouth 2 (two) times daily. 02/05/17  Yes [provider]  acetaminophen  (TYLENOL ) 500 MG tablet Take 2 tablets (1,000 mg total) by mouth every 6 (six) hours as needed for mild pain. 01/17/21  Yes Nasia Cannan, Aloysius, MD  albuterol  (VENTOLIN  HFA) 108 (90 Base) MCG/ACT inhaler Inhale 1-2 puffs into the lungs every 6 (six) hours as needed for shortness of breath or wheezing. 02/24/19  Yes  [provider]  flecainide  (TAMBOCOR ) 100 MG tablet Take 100 mg by mouth 2 (two) times daily. 02/05/17  Yes [provider]  ibuprofen  (ADVIL ) 800 MG tablet Take 1 tablet (800 mg total) by mouth every 8 (eight) hours as needed for moderate pain. 01/17/21  Yes Gil Ingwersen, Aloysius, MD  midodrine (PROAMATINE) 10 MG tablet Take 10 mg by mouth 3 (three) times daily. 02/05/17  Yes [provider]  ondansetron  (ZOFRAN ) 4 MG tablet Take 4 mg by mouth every 8 (eight) hours as needed for nausea or vomiting.   Yes [provider]  promethazine  (PHENERGAN ) 25 MG suppository Place 1 suppository (25 mg total) rectally every 6 (six) hours as needed for nausea or vomiting. 10/21/21  Yes Gordan Huxley, MD  sertraline  (ZOLOFT ) 50 MG tablet Take 75 mg by mouth daily. 02/05/17  Yes [provider]  sucralfate  (CARAFATE ) 1 g tablet Take 1 tablet (1 g total) by mouth 4 (four) times daily as needed (for abdominal discomfort, nausea, and/or vomiting). 10/21/21  Yes Gordan Huxley, MD    Allergies: No Known Allergies  Review of Systems: Review of Systems  Constitutional:  Negative for chills and fever.  Respiratory:  Negative for shortness of breath.   Cardiovascular:  Negative for chest pain.  Gastrointestinal:  Positive for abdominal pain and nausea.    Physical Exam BP (!) 161/104   Pulse  63   Temp 98.6 F (37 C) (Oral)   Ht 5' 2.75 (1.594 m)   Wt 163 lb 3.2 oz (74 kg)   SpO2 97%   BMI 29.14 kg/m  CONSTITUTIONAL: No acute distress, well-nourished HEENT:  Normocephalic, atraumatic, extraocular motion intact. RESPIRATORY:  Normal respiratory effort without pathologic use of accessory muscles. CARDIOVASCULAR: Regular rhythm and rate. GI: The abdomen is soft, nondistended, with some discomfort to palpation in the epigastric area.  There is no evidence of any hernia recurrence.  All of her robotic incisions are well-healed.  NEUROLOGIC:  Motor and sensation is grossly  normal.  Cranial nerves are grossly intact. PSYCH:  Alert and oriented to person, place and time. Affect is normal.  Assessment and Plan: This is a 48 y.o. female with chronic epigastric pain.  - Discussed with patient that it would be very unlikely that adhesions would be causing her that degree of abdominal pain.  Overall adhesions may contribute to some tightness or pulling or perhaps if more severe could cause episodes of bowel obstruction but none of her imaging studies have shown that in the past.  Discussed with her also that minimally invasive surgery produces less scar tissue compared to bigger open abdominal surgeries.  Her epigastric pain also seems to be more positional when she is bending over or lifting something rather than happening after eating. - As a precaution, however, we will order a CT scan of her abdomen and pelvis with contrast to further evaluate for any other potential mechanical or functional issues.  I will contact the patient with the results.  At this point do not think she will require any surgical intervention but we will confirm this more once the scan is done. --Also with regards of her blood pressure.  Have recommended that she contact her PCP for further management.  I spent 20 minutes dedicated to the care of this patient on the date of this encounter to include pre-visit review of records, face-to-face time with the patient discussing diagnosis and management, and any post-visit coordination of care.   Aloysius Sheree Plant, MD [facility]

## 2023-08-01 NOTE — Patient Instructions (Addendum)
 Your CT is scheduled for 08/04/2023 at [facility] 8 am (arrive by 7:45 am).   Small Bowel Series  A small bowel series is an X-ray test. It is used to check for problems in the small bowel. The small bowel is also called the small intestine. For this test, you will drink a liquid called barium. The liquid (contrast liquid) shows up well on X-rays. This makes it easier for your doctor to see any problems. The test can help to find out why you have symptoms such as: Belly (abdominal) pain. Bloating. Watery poop (diarrhea). Tell your doctor about: Any allergies you have, especially allergies to liquids used in imaging tests. All medicines you are taking. This includes vitamins, herbs, eye drops, creams, and over-the-counter medicines. Any blood disorders you have. Any surgeries you have had. Any medical conditions you have. Whether you are pregnant or may be pregnant. Whether you are breastfeeding. What are the risks? Usually, this is a safe test. However, problems may happen, such as: Feeling like you may vomit (nauseous) after drinking the contrast liquid. Cramps. Trouble pooping (constipation). A blockage in your small bowel getting worse. Exposure to a very small amount of radiation. Allergic reaction to the contrast liquid. This is rare. What happens before the test? Follow instructions from your doctor about what you cannot eat or drink. Ask your doctor about changing or stopping: Your normal medicines. Vitamins, herbs, and supplements. Over-the-counter medicines. What happens during the test? You will drink the contrast liquid. It looks like a milkshake. Using a type of X-ray, the doctor will watch the contrast liquid as it moves through: The part of your body that moves food from your mouth to your stomach (esophagus). Your stomach. Your small bowel. Plain X-ray pictures will be taken often as the contrast liquid moves through your small bowel. These may be taken every  15-60 minutes. You may need to change positions often so the doctor can see all of the small bowel. You may need to stand up or lie on a table. The table may move or tilt. You may need to turn from side to side. The procedure may vary among doctors and hospitals. What can I expect after the test? Your poop (stool) may be white or gray for 2-3 days until all the contrast liquid has passed out of your body in your poop. It is up to you to get the results of your test. Ask how to get your results when they are ready. Talk with your doctor about what your results mean. Follow these instructions at home:  Return to your normal diet as told by your doctor. Return to your normal activities when your doctor says that it is safe. Check your poop to make sure that it returns to normal color within a few days. If told, take actions to prevent trouble pooping and to help get the contrast liquid out of your body. You may need to: Drink enough fluid to keep your pee (urine) pale yellow. Take medicines. You will be told what medicines to take. Eat foods that are high in fiber. These include beans, whole grains, and fresh fruits and vegetables. Limit foods that are high in fat and sugar. These include fried or sweet foods. Contact a doctor if: You have trouble pooping for more than 2 days. Your poop still looks white or chalky after 3 days. You have cramps or pain. You have watery poop. You have swelling of your belly. You feel like you may  vomit or you vomit. You have a fever. Get help right away if: You are not able to poop. You cannot pass gas. You have belly pain that gets worse. You have itchy, red, swollen areas of skin (hives). Your throat swells. You have trouble breathing. You have a very hard and bloated (distended) belly. These symptoms may be an emergency. Get help right away. Call your local emergency services (911 in the [place] Do not wait to see if the symptoms will go away. Do  not drive yourself to the hospital. Summary A small bowel series is an X-ray test. It is used to check for problems in the small bowel. For this test, you will drink a contrast liquid called barium. The liquid shows up well on X-rays and makes it easier for your doctor to see problems. After the test, your poop may be white or gray for 2-3 days until all the contrast liquid has passed out of your body. This information is not intended to replace advice given to you by your health care provider. Make sure you discuss any questions you have with your health care provider. Document Revised: 03/28/2021 Document Reviewed: 08/28/2020 Elsevier Patient Education  2024 [employer].

## 2023-08-04 ENCOUNTER — Ambulatory Visit
Admission: RE | Admit: 2023-08-04 | Discharge: 2023-08-04 | Disposition: A | Discharge status: Home / Self Care | Payer: Medicare Other | Source: Ambulatory Visit | Attending: Surgery | Admitting: Surgery

## 2023-08-04 DIAGNOSIS — R1013 Epigastric pain: Secondary | ICD-10-CM | POA: Insufficient documentation

## 2023-08-04 DIAGNOSIS — G8929 Other chronic pain: Secondary | ICD-10-CM | POA: Diagnosis present

## 2023-08-04 MED ORDER — IOHEXOL 300 MG/ML  SOLN
100.0000 mL | Freq: Once | INTRAMUSCULAR | Status: AC | PRN
  Administered 2023-08-04: 100 mL via INTRAVENOUS

## 2024-01-01 ENCOUNTER — Observation Stay
Admission: EM | Admit: 2024-01-01 | Discharge: 2024-01-02 | Disposition: A | Discharge status: Home / Self Care | Attending: Internal Medicine | Admitting: Internal Medicine

## 2024-01-01 ENCOUNTER — Other Ambulatory Visit: Payer: Self-pay

## 2024-01-01 ENCOUNTER — Emergency Department

## 2024-01-01 DIAGNOSIS — K046 Periapical abscess with sinus: Secondary | ICD-10-CM | POA: Insufficient documentation

## 2024-01-01 DIAGNOSIS — L03211 Cellulitis of face: Secondary | ICD-10-CM | POA: Diagnosis not present

## 2024-01-01 DIAGNOSIS — F419 Anxiety disorder, unspecified: Secondary | ICD-10-CM | POA: Insufficient documentation

## 2024-01-01 DIAGNOSIS — K047 Periapical abscess without sinus: Secondary | ICD-10-CM | POA: Diagnosis present

## 2024-01-01 DIAGNOSIS — J45909 Unspecified asthma, uncomplicated: Secondary | ICD-10-CM | POA: Insufficient documentation

## 2024-01-01 DIAGNOSIS — R220 Localized swelling, mass and lump, head: Secondary | ICD-10-CM | POA: Diagnosis present

## 2024-01-01 DIAGNOSIS — F32A Depression, unspecified: Secondary | ICD-10-CM | POA: Insufficient documentation

## 2024-01-01 DIAGNOSIS — R55 Syncope and collapse: Secondary | ICD-10-CM | POA: Diagnosis not present

## 2024-01-01 LAB — BASIC METABOLIC PANEL WITH GFR
Anion gap: 10 (ref 5–15)
BUN: 15 mg/dL (ref 6–20)
CO2: 23 mmol/L (ref 22–32)
Calcium: 8.8 mg/dL — ABNORMAL LOW (ref 8.9–10.3)
Chloride: 110 mmol/L (ref 98–111)
Creatinine, Ser: 0.55 mg/dL (ref 0.44–1.00)
GFR, Estimated: >60 mL/min (ref >60)
Glucose, Bld: 103 mg/dL — ABNORMAL HIGH (ref 70–99)
Potassium: 4 mmol/L (ref 3.5–5.1)
Sodium: 143 mmol/L (ref 135–145)

## 2024-01-01 LAB — CBC WITH DIFFERENTIAL/PLATELET
Abs Immature Granulocytes: 0.02 10*3/uL (ref 0.00–0.07)
Basophils Absolute: 0 10*3/uL (ref 0.0–0.1)
Basophils Relative: 1 %
Eosinophils Absolute: 0.5 10*3/uL (ref 0.0–0.5)
Eosinophils Relative: 6 %
HCT: 38.5 % (ref 36.0–46.0)
Hemoglobin: 13.1 g/dL (ref 12.0–15.0)
Immature Granulocytes: 0 %
Lymphocytes Relative: 31 %
Lymphs Abs: 2.7 10*3/uL (ref 0.7–4.0)
MCH: 31.6 pg (ref 26.0–34.0)
MCHC: 34 g/dL (ref 30.0–36.0)
MCV: 93 fL (ref 80.0–100.0)
Monocytes Absolute: 0.7 10*3/uL (ref 0.1–1.0)
Monocytes Relative: 8 %
Neutro Abs: 4.7 10*3/uL (ref 1.7–7.7)
Neutrophils Relative %: 54 %
Platelets: 196 10*3/uL (ref 150–400)
RBC: 4.14 MIL/uL (ref 3.87–5.11)
RDW: 12.2 % (ref 11.5–15.5)
WBC: 8.6 10*3/uL (ref 4.0–10.5)
nRBC: 0 % (ref 0.0–0.2)

## 2024-01-01 MED ORDER — ACEBUTOLOL HCL POWD: 100.0000 mg | Freq: Two times a day (BID) | Status: DC

## 2024-01-01 MED ORDER — DEXAMETHASONE SODIUM PHOSPHATE 10 MG/ML IJ SOLN
10.0000 mg | Freq: Once | INTRAMUSCULAR | Status: AC
  Administered 2024-01-01: 10 mg via INTRAVENOUS
  Filled 2024-01-01: qty 1

## 2024-01-01 MED ORDER — ATORVASTATIN CALCIUM 20 MG PO TABS
40.0000 mg | ORAL_TABLET | Freq: Every day | ORAL | Status: DC
  Administered 2024-01-01 – 2024-01-02 (×2): 40 mg via ORAL
  Filled 2024-01-01 (×2): qty 2

## 2024-01-01 MED ORDER — ACETAMINOPHEN 325 MG PO TABS
650.0000 mg | ORAL_TABLET | Freq: Four times a day (QID) | ORAL | Status: DC | PRN
  Administered 2024-01-01: 650 mg via ORAL
  Filled 2024-01-01: qty 2

## 2024-01-01 MED ORDER — OXYCODONE HCL 5 MG PO TABS: 5.0000 mg | ORAL_TABLET | ORAL | Status: DC | PRN

## 2024-01-01 MED ORDER — ONDANSETRON HCL 4 MG/2ML IJ SOLN: 4.0000 mg | Freq: Once | INTRAMUSCULAR | Status: DC

## 2024-01-01 MED ORDER — SERTRALINE HCL 50 MG PO TABS
75.0000 mg | ORAL_TABLET | Freq: Every day | ORAL | Status: DC
  Administered 2024-01-01 – 2024-01-02 (×2): 75 mg via ORAL
  Filled 2024-01-01 (×2): qty 2

## 2024-01-01 MED ORDER — BISACODYL 5 MG PO TBEC
5.0000 mg | DELAYED_RELEASE_TABLET | Freq: Every day | ORAL | Status: DC | PRN
Start: 2024-01-01 — End: 2024-01-02

## 2024-01-01 MED ORDER — SODIUM CHLORIDE 0.9 % IV SOLN
3.0000 g | INTRAVENOUS | Status: AC
  Administered 2024-01-01: 3 g via INTRAVENOUS
  Filled 2024-01-01: qty 8

## 2024-01-01 MED ORDER — HYDRALAZINE HCL 20 MG/ML IJ SOLN: 10.0000 mg | Freq: Four times a day (QID) | INTRAMUSCULAR | Status: DC | PRN

## 2024-01-01 MED ORDER — IOHEXOL 300 MG/ML  SOLN
75.0000 mL | Freq: Once | INTRAMUSCULAR | Status: AC | PRN
  Administered 2024-01-01: 75 mL via INTRAVENOUS

## 2024-01-01 MED ORDER — FLECAINIDE ACETATE 50 MG PO TABS
50.0000 mg | ORAL_TABLET | Freq: Two times a day (BID) | ORAL | Status: DC
  Administered 2024-01-01 – 2024-01-02 (×2): 50 mg via ORAL
  Filled 2024-01-01 (×3): qty 1

## 2024-01-01 MED ORDER — KETOROLAC TROMETHAMINE 15 MG/ML IJ SOLN: 15.0000 mg | Freq: Once | INTRAMUSCULAR | Status: DC

## 2024-01-01 MED ORDER — SUCRALFATE 1 G PO TABS: 1.0000 g | ORAL_TABLET | Freq: Four times a day (QID) | ORAL | Status: DC | PRN

## 2024-01-01 MED ORDER — ACEBUTOLOL HCL 200 MG PO CAPS
200.0000 mg | ORAL_CAPSULE | Freq: Every day | ORAL | Status: DC
  Filled 2024-01-01: qty 1

## 2024-01-01 MED ORDER — SODIUM CHLORIDE 0.9 % IV SOLN
3.0000 g | Freq: Four times a day (QID) | INTRAVENOUS | Status: DC
  Administered 2024-01-01 – 2024-01-02 (×4): 3 g via INTRAVENOUS
  Filled 2024-01-01 (×6): qty 8

## 2024-01-01 MED ORDER — ENOXAPARIN SODIUM 40 MG/0.4ML IJ SOSY
40.0000 mg | PREFILLED_SYRINGE | INTRAMUSCULAR | Status: DC
  Administered 2024-01-01: 40 mg via SUBCUTANEOUS
  Filled 2024-01-01: qty 0.4

## 2024-01-01 MED ORDER — ACEBUTOLOL HCL 200 MG PO CAPS
200.0000 mg | ORAL_CAPSULE | Freq: Two times a day (BID) | ORAL | Status: DC
  Administered 2024-01-01 – 2024-01-02 (×2): 200 mg via ORAL
  Filled 2024-01-01 (×4): qty 1

## 2024-01-01 MED ORDER — DEXAMETHASONE SODIUM PHOSPHATE 10 MG/ML IJ SOLN
10.0000 mg | Freq: Three times a day (TID) | INTRAMUSCULAR | Status: DC
  Administered 2024-01-01 – 2024-01-02 (×3): 10 mg via INTRAVENOUS
  Filled 2024-01-01 (×3): qty 1

## 2024-01-01 MED ORDER — ACETAMINOPHEN 650 MG RE SUPP: 650.0000 mg | Freq: Four times a day (QID) | RECTAL | Status: DC | PRN

## 2024-01-01 MED ORDER — KETOROLAC TROMETHAMINE 30 MG/ML IJ SOLN
30.0000 mg | Freq: Three times a day (TID) | INTRAMUSCULAR | Status: DC | PRN
  Administered 2024-01-01: 30 mg via INTRAVENOUS
  Filled 2024-01-01: qty 1

## 2024-01-01 MED ORDER — POLYETHYLENE GLYCOL 3350 17 G PO PACK
17.0000 g | PACK | Freq: Every day | ORAL | Status: DC | PRN
Start: 2024-01-01 — End: 2024-01-02

## 2024-01-01 MED ORDER — ALBUTEROL SULFATE (2.5 MG/3ML) 0.083% IN NEBU: 2.5000 mg | INHALATION_SOLUTION | Freq: Four times a day (QID) | RESPIRATORY_TRACT | Status: DC | PRN

## 2024-01-01 MED ORDER — MORPHINE SULFATE (PF) 2 MG/ML IV SOLN: 2.0000 mg | INTRAVENOUS | Status: DC | PRN

## 2024-01-01 NOTE — ED Provider Notes (Signed)
 [facility] Provider Note    Event Date/Time   First MD Initiated Contact with Patient 01/01/24 [phone]     (approximate)   History   Facial Swelling   HPI Taylor Copeland is a 48 y.o. female who presents for evaluation of swelling to the right side of her face.  This was acute in onset; she states that she went to sleep last night at about 9 PM and when she awoke at about 5 AM and discovered substantial swelling to the right side of her face.  It is not particularly painful and she is not having any difficulty swallowing or breathing, but it is worrisome about how quickly it started.  She has had no recent fever .  She has been struggling with dental issues for an extended period of time.  She has a Radiation protection practitioner who did some work on the right upper part of her teeth a few weeks ago.  They are waiting to do some additional work.  She has not been on antibiotics recently.     Physical Exam   Triage Vital Signs: ED Triage Vitals  Encounter Vitals Group     BP 01/01/24 0436 (!) 136/94     Systolic BP Percentile --      Diastolic BP Percentile --      Pulse Rate 01/01/24 0436 81     Resp 01/01/24 0436 18     Temp 01/01/24 0436 98.4 F (36.9 C)     Temp src --      SpO2 01/01/24 0436 100 %     Weight 01/01/24 0435 70.3 kg (155 lb)     Height 01/01/24 0435 1.6 m (5\' 3" )     Head Circumference --      Peak Flow --      Pain Score 01/01/24 0435 2     Pain Loc --      Pain Education --      Exclude from Growth Chart --     Most recent vital signs: Vitals:   01/01/24 0615 01/01/24 0630  BP:    Pulse: 83 74  Resp:    Temp:    SpO2: 96% 99%    General: Awake, no obvious distress.  Alert and conversant. Face:  Obvious sided facial/cheek swelling with induration all throughout the maxillary area on the right side.  There is some swelling that extends down towards the edge of the mandible.  No submandibular induration.  No obvious drainable fluid  collection/abscess within the oropharynx including apical abscesses of the teeth.  She has multiple chronic caries and dental abnormalities on the right upper part of her mouth.  Below the tongue is soft and supple, no evidence of Ludwig's angina.  No trismus. CV:  Good peripheral perfusion.  Resp:  Normal effort. Speaking easily and comfortably, no accessory muscle usage nor intercostal retractions.   Abd:  No distention.    ED Results / Procedures / Treatments   Labs (all labs ordered are listed, but only abnormal results are displayed) Labs Reviewed  BASIC METABOLIC PANEL WITH GFR - Abnormal; Notable for the following components:      Result Value   Glucose, Bld 103 (*)    Calcium 8.8 (*)    All other components within normal limits  CBC WITH DIFFERENTIAL/PLATELET      RADIOLOGY See ED course for details   PROCEDURES:  Critical Care performed: No  Procedures    IMPRESSION / MDM /  ASSESSMENT AND PLAN / ED COURSE  I reviewed the triage vital signs and the nursing notes.                              Differential diagnosis includes, but is not limited to, facial cellulitis, facial abscess or phlegmon, Ludwig's angina, parotitis.  Patient's presentation is most consistent with acute presentation with potential threat to life or bodily function.  Labs/studies ordered: BMP, CBC with differential, CT soft tissue neck with contrast  Interventions/Medications given:  Medications  dexamethasone  (DECADRON ) injection 10 mg (10 mg Intravenous Given 01/01/24 0529)  Ampicillin -Sulbactam (UNASYN ) 3 g in sodium chloride  0.9 % 100 mL IVPB (0 g Intravenous Stopped 01/01/24 0633)  iohexol  (OMNIPAQUE ) 300 MG/ML solution 75 mL (75 mLs Intravenous Contrast Given 01/01/24 0601)    (Note:  hospital course my include additional interventions and/or labs/studies not listed above.)   Labs pending, medications as listed above including empiric antibiotics and Decadron  for the swelling.  I  ordered a CT soft tissue neck because that should go superior enough to reach the top border of the infection and tracked down low enough to make sure it is not tracking into her neck.  She understands and agrees with the plan.   Clinical Course as of 01/01/24 0726  Thu Jan 01, 2024  0545 CBC with Differential normal CBC without leukocytosis [CF]  0607 Basic metabolic panel(!) Reassuring BMP [CF]  0347 CT Soft Tissue Neck W Contrast I independently viewed and interpreted the patient's CT soft tissue neck.  She has obvious facial cellulitis and some lucencies to suggest dental abscesses.  This was confirmed by radiology.  Given the extent of the cellulitic area and rapid worsening, I feel she would benefit from inpatient treatment.  I consulted by phone with Dr. Donnie Galea with ENT and he concurred and will see the patient in the morning if consulted by the hospitalist team; he will also check to make sure the Dr. Pollyann Brinks with [facility] is not on-call today.  I updated the patient and she is feeling better after the medication.  Swelling may have gone down a slight bit.  She agrees with the plan for inpatient treatment.  Consulting hospitalist team for admission. [CF]    Clinical Course User Index [CF] Lynnda Sas, MD     FINAL CLINICAL IMPRESSION(S) / ED DIAGNOSES   Final diagnoses:  Facial cellulitis  Dental abscess     Rx / DC Orders   ED Discharge Orders     None        Note:  This document was prepared using Dragon voice recognition software and may include unintentional dictation errors.   Lynnda Sas, MD 01/01/24 [phone]

## 2024-01-01 NOTE — ED Triage Notes (Signed)
 Pt reports she woke up this morning with right side facial swelling, pt has known infected tooth, waiting to see dentist.

## 2024-01-01 NOTE — H&P (Addendum)
 [facility] History and Physical  Taylor Copeland ZHY:[id number] DOB: 1976-06-12 DOA: 01/01/2024 PCP: Melchor Spoon, MD  Presented from: Home Chief Complaint: Facial swelling  History of Present Illness: Taylor Copeland is a 48 y.o. female with PMH significant for neurocardiogenic syncope. Patient presented to the ED today with complaint of right-sided facial swelling.  She has known infected tooth and waiting to see a dentist. No fever.  No difficulty swallowing.  She has a Radiation protection practitioner who did some work on the right upper part of her tooth a few weeks ago.  She is waiting for additional work.  In the ED, afebrile, hemodynamically stable. Labs showed multiple CBC, BMP. CT soft tissue neck showed right maxillary first premolar (tooth 5) periapical lucency and 5 mm subperiosteal abscess with surrounding inflammatory changes extending into the subcutaneous space from the angle of the mandible to the right infraorbital margin, consistent with soft tissue infection. Prominent asymmetric soft tissue swelling at the right nasolabial fold. No other discrete abscesses identified. Patient was given IV Unasyn , IV dexamethasone .  Seen by ENT Dr. Donnie Galea. Recommended inpatient management with IV antibiotics and steroids  At the time of my evaluation, patient was propped up in bed.  Not in distress. No family at bedside. I confirmed that she is on an antiarrhythmic.  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 Harwood Lingo at [facility].  She also used to be on midodrine in the past but currently not on it.  Review of Systems:  All systems were reviewed and were negative unless otherwise mentioned in the HPI   Past medical history: Past Medical History:  Diagnosis Date   Asthma    Syncope    neurocardiogenic syncope   Syncope     Past surgical history: Past  Surgical History:  Procedure Laterality Date   APPENDECTOMY     AUGMENTATION MAMMAPLASTY Bilateral    20 + years ago   CHOLECYSTECTOMY     LAPAROSCOPIC APPENDECTOMY  08/01/2013   PLACEMENT OF BREAST IMPLANTS     TUBAL LIGATION     tummy tuck     XI ROBOTIC ASSISTED VENTRAL HERNIA N/A 02/11/2020   incarcerated omentum in umbilical incisional hernia    Social History:  reports that she has been smoking e-cigarettes. She has never used smokeless tobacco. She reports current alcohol use. She reports current drug use. Drug: Marijuana.  Allergies:  No Known Allergies Patient has no known allergies.   Family history:  Family History  Adopted: Yes  Problem Relation Age of Onset   Diabetes Mother      Physical Exam: Vitals:   01/01/24 0630 01/01/24 1016 01/01/24 1030 01/01/24 1126  BP:   117/78 (!) 124/90  Pulse: 74  70 68  Resp:   (!) 21 16  Temp:  98.3 F (36.8 C)  98.4 F (36.9 C)  TempSrc:  Oral    SpO2: 99%  98% 100%  Weight:      Height:       Wt Readings from Last 3 Encounters:  01/01/24 70.3 kg  08/01/23 74 kg  10/21/21 68 kg   Body mass index is 27.46 kg/m.  General exam: Pleasant, middle-aged female Skin: No rashes, lesions or ulcers. HEENT: Atraumatic, normocephalic, no obvious bleeding.  Mild swelling noted on the right side of the face Lungs: Clear to auscultation  bilaterally,  CVS: S1, S2, no murmur,   GI/Abd: Soft, nontender, nondistended, bowel sound present,   CNS: Alert, awake, oriented x 3 Psychiatry: Mood appropriate,  Extremities: No pedal edema, no calf tenderness,     ----------------------------------------------------------------------------------------------------------------------------------------- ----------------------------------------------------------------------------------------------------------------------------------------- -----------------------------------------------------------------------------------------------------------------------------------------  Assessment/Plan: Dental abscess Facial cellulitis Presented with facial swelling. No fever.  WBC count normal Seen by ENT Recommended to continue IV Unasyn  and dexamethasone  Soft diet ordered. Continue to monitor Recent Labs  Lab 01/01/24 0506  WBC 8.6   H/o neurocardiogenic syncope since the age of 39 and has been taking flecainide 50 mg twice daily and acebutolol 100 mg twice daily, under the care of Dr. Harwood Lingo at [facility].  She also used to be on midodrine in the past but currently not on it.  Anxiety/depression Continue Zoloft daily   Mobility: Encourage ambulation  Goals of care:   Code Status: Full Code    DVT prophylaxis:  enoxaparin  (LOVENOX ) injection 40 mg Start: 01/01/24 2200   Antimicrobials: IV Unasyn  Fluid: None Consultants: ENT Family Communication: None at bedside  Status: Observation Level of care:  Telemetry Medical   Patient is from: Home Anticipated d/c to: Hopefully home tomorrow.  Patient states he has a flight to [place] to catch at 12.  She was able to postpone till next light at 4 PM.    Diet:  Diet Order             DIET SOFT Room service appropriate? Yes; Fluid consistency: Thin  Diet effective now                    ------------------------------------------------------------------------------------- Severity of Illness: The appropriate patient status for this patient is OBSERVATION. Observation status is judged to be reasonable and necessary in order to provide the required intensity of service to ensure the  patient's safety. The patient's presenting symptoms, physical exam findings, and initial radiographic and laboratory data in the context of their medical condition is felt to place them at decreased risk for further clinical deterioration. Furthermore, it is anticipated that the patient will be medically stable for discharge from the hospital within 2 midnights of admission.  -------------------------------------------------------------------------------------   Home Meds: Prior to Admission medications   Medication Sig Start Date End Date Taking? Authorizing Provider  acebutolol (SECTRAL) 200 MG capsule Take 200 mg by mouth 2 (two) times daily. 02/05/17   [provider]  acetaminophen  (TYLENOL ) 500 MG tablet Take 2 tablets (1,000 mg total) by mouth every 6 (six) hours as needed for mild pain. 01/17/21   Emmalene Hare, MD  albuterol (VENTOLIN HFA) 108 (90 Base) MCG/ACT inhaler Inhale 1-2 puffs into the lungs every 6 (six) hours as needed for shortness of breath or wheezing. 02/24/19   [provider]  flecainide (TAMBOCOR) 100 MG tablet Take 100 mg by mouth 2 (two) times daily. 02/05/17   [provider]  ibuprofen  (ADVIL ) 800 MG tablet Take 1 tablet (800 mg total) by mouth every 8 (eight) hours as needed for moderate pain. 01/17/21   Piscoya, Jose, MD  midodrine (PROAMATINE) 10 MG tablet Take 10 mg by mouth 3 (three) times daily. 02/05/17   [provider]  ondansetron  (ZOFRAN ) 4 MG tablet Take 4 mg by mouth every 8 (eight) hours as needed for nausea or vomiting.    [provider]  promethazine  (PHENERGAN ) 25 MG suppository Place 1 suppository (25 mg total) rectally every 6 (six) hours as needed for nausea or vomiting. 10/21/21   Lynnda Sas, MD  sertraline (ZOLOFT) 50 MG tablet Take 75 mg by mouth daily. 02/05/17   [provider]  sucralfate  (CARAFATE ) 1 g tablet Take 1 tablet (1 g total) by mouth 4 (four) times daily as needed (for abdominal  discomfort, nausea, and/or vomiting). 10/21/21   Lynnda Sas, MD    Labs on Admission:   CBC: Recent Labs  Lab 01/01/24 0506  WBC 8.6  NEUTROABS 4.7  HGB 13.1  HCT 38.5  MCV 93.0  PLT 196    Basic Metabolic Panel: Recent Labs  Lab 01/01/24 0506  NA 143  K 4.0  CL 110  CO2 23  GLUCOSE 103*  BUN 15  CREATININE 0.55  CALCIUM 8.8*    Liver Function Tests: No results for input(s): "AST", "ALT", "ALKPHOS", "BILITOT", "PROT", "ALBUMIN" in the last 168 hours. No results for input(s): "LIPASE", "AMYLASE" in the last 168 hours. No results for input(s): "AMMONIA" in the last 168 hours.  Cardiac Enzymes: No results for input(s): "CKTOTAL", "CKMB", "CKMBINDEX", "TROPONINI" in the last 168 hours.  BNP (last 3 results) No results for input(s): "BNP" in the last 8760 hours.  ProBNP (last 3 results) No results for input(s): "PROBNP" in the last 8760 hours.  CBG: No results for input(s): "GLUCAP" in the last 168 hours.  Lipase     Component Value Date/Time   LIPASE 31 01/25/2022 1615   LIPASE 122 08/01/2013 0918     Urinalysis    Component Value Date/Time   COLORURINE YELLOW (A) 01/25/2022 1615   APPEARANCEUR HAZY (A) 01/25/2022 1615   APPEARANCEUR Clear 08/01/2013 1245   LABSPEC 1.023 01/25/2022 1615   LABSPEC 1.005 08/01/2013 1245   PHURINE 9.0 (H) 01/25/2022 1615   GLUCOSEU 50 (A) 01/25/2022 1615   GLUCOSEU Negative 08/01/2013 1245   HGBUR NEGATIVE 01/25/2022 1615   BILIRUBINUR NEGATIVE 01/25/2022 1615   BILIRUBINUR Negative 08/01/2013 1245   KETONESUR 20 (A) 01/25/2022 1615   PROTEINUR 30 (A) 01/25/2022 1615   NITRITE NEGATIVE 01/25/2022 1615   LEUKOCYTESUR NEGATIVE 01/25/2022 1615   LEUKOCYTESUR Negative 08/01/2013 1245     Drugs of Abuse     Component Value Date/Time   LABOPIA NONE DETECTED 01/17/2021 0802   COCAINSCRNUR NONE DETECTED 01/17/2021 0802   LABBENZ NONE DETECTED 01/17/2021 0802   AMPHETMU NONE DETECTED 01/17/2021 0802   THCU POSITIVE  (A) 01/17/2021 0802   LABBARB NONE DETECTED 01/17/2021 0802      Radiological Exams on Admission: CT Soft Tissue Neck W Contrast Result Date: 01/01/2024 EXAM: CT NECK WITH CONTRAST 01/01/2024 06:13:28 AM TECHNIQUE: CT of the neck was performed with the administration of intravenous contrast. Multiplanar reformatted images are provided for review. Automated exposure control, iterative reconstruction, and/or weight based adjustment of the mA/kV was utilized to reduce the radiation dose to as low as reasonably achievable. CONTRAST: 75mL iohexol  (OMNIPAQUE ) 300 MG/ML solution COMPARISON: None available. CLINICAL HISTORY: Soft tissue infection suspected, neck, xray done. Table formatting from the original note was not included. Images from the original note were not included. Pt reports she woke up this morning with right side facial swelling, pt has known infected tooth, waiting to see dentist. FINDINGS: PHARYNX AND LARYNX: The tonsillar pillars are normal in appearance. The tongue is normal in appearance. The valleculae, epiglottis, aryepiglottic folds and pyriform sinuses appear unremarkable. The true and false vocal cords are normal in appearance. No mass or abscess is seen. SALIVARY GLANDS: The parotid and submandibular glands are unremarkable. THYROID: The thyroid gland is unremarkable. No nodule. LYMPH NODES:  No lymphadenopathy. SOFT TISSUES: Focal dental caries is present in the right first premolar maxillary tooth (tooth number 5). Periapical lucency is present. A subperiosteal abscess measures 5 mm. Surrounding inflammatory changes extend into the subcutaneous space, extending from the angle of the mandible to the right infraorbital margin. Prominent asymmetric soft tissue swelling is present at the right nasolabial fold. No other discrete abscesses are present. LIMITED BRAIN, ORBITS AND SINUSES: The orbit is otherwise within normal limits. The visualized portion of the intracranial contents demonstrate no  acute abnormality. The visualized portion of the orbits are without acute abnormality. The visualized paranasal sinuses and mastoid air cells demonstrate no acute abnormality. LUNG APICES AND SUPERIOR MEDIASTINUM: No acute abnormality in the visualized lung apices. No superior mediastinal lymphadenopathy or mass. The visualized portion of the trachea appears unremarkable. BONES: Straightening and slight reversal of the normal cervical lordosis are present. No acute osseous abnormality. IMPRESSION: 1. Right maxillary first premolar (tooth 5) periapical lucency and 5 mm subperiosteal abscess with surrounding inflammatory changes extending into the subcutaneous space from the angle of the mandible to the right infraorbital margin, consistent with soft tissue infection. 2. Prominent asymmetric soft tissue swelling at the right nasolabial fold. 3. No other discrete abscesses identified. Electronically signed by: Audree Leas MD 01/01/2024 06:24 AM EDT RP Workstation: [id number]     Signed, Hoyt Macleod, MD [facility] 01/01/2024

## 2024-01-01 NOTE — Consult Note (Signed)
 Taylor Copeland, Taylor Copeland [id number] [birth year] Hoyt Macleod, MD  Reason for Consult: Facial swelling, odontogenic abscess  HPI: Patient is a 48 year old female with history of acute onset of right sided facial swelling.  Reports known history of right tooth issue and had appointment with a dentist that was rescheduled.  No recent antibiotics.  Presented to ER due to acute swelling.  Tenderness to palpation but no significant pain.  No breathing issues or swallowing issues.  No prior similar issues.  Allergies: No Known Allergies  ROS: Review of systems normal other than 12 systems except per HPI.  PMH:  Past Medical History:  Diagnosis Date   Asthma    Syncope    neurocardiogenic syncope   Syncope     FH:  Family History  Adopted: Yes  Problem Relation Age of Onset   Diabetes Mother     SH:  Social History   Socioeconomic History   Marital status: Married    Spouse name: Not on file   Number of children: Not on file   Years of education: Not on file   Highest education level: Not on file  Occupational History   Not on file  Tobacco Use   Smoking status: Some Days    Types: E-cigarettes   Smokeless tobacco: Never  Vaping Use   Vaping status: Every Day   Substances: Nicotine, Flavoring  Substance and Sexual Activity   Alcohol use: Yes    Comment: wine occasionally   Drug use: Yes    Types: Marijuana   Sexual activity: Not on file  Other Topics Concern   Not on file  Social History Narrative   Not on file   Social Drivers of Health   Financial Resource Strain: Patient Declined (06/11/2023)   Received from [facility]   Overall Financial Resource Strain (CARDIA)    Difficulty of Paying Living Expenses: Patient declined  Food Insecurity: No Food Insecurity (01/01/2024)   Hunger Vital Sign    Worried About Running Out of Food in the Last Year: Never true    Ran Out of Food in the Last Year: Never true  Transportation Needs: No Transportation Needs  (01/01/2024)   PRAPARE - Administrator, Civil Service (Medical): No    Lack of Transportation (Non-Medical): No  Physical Activity: Not on file  Stress: Not on file  Social Connections: Unknown (01/01/2024)   Social Connection and Isolation Panel [NHANES]    Frequency of Communication with Friends and Family: More than three times a week    Frequency of Social Gatherings with Friends and Family: More than three times a week    Attends Religious Services: Patient declined    Database administrator or Organizations: Not on file    Attends Banker Meetings: Patient declined    Marital Status: Patient declined  Intimate Partner Violence: Not At Risk (01/01/2024)   Humiliation, Afraid, Rape, and Kick questionnaire    Fear of Current or Ex-Partner: No    Emotionally Abused: No    Physically Abused: No    Sexually Abused: No    PSH:  Past Surgical History:  Procedure Laterality Date   APPENDECTOMY     AUGMENTATION MAMMAPLASTY Bilateral    20 + years ago   CHOLECYSTECTOMY     LAPAROSCOPIC APPENDECTOMY  08/01/2013   PLACEMENT OF BREAST IMPLANTS     TUBAL LIGATION     tummy tuck     XI ROBOTIC ASSISTED VENTRAL HERNIA N/A  02/11/2020   incarcerated omentum in umbilical incisional hernia    Physical  Exam:  General: Well-developed, Well-nourished in no acute distress Mood: Mood and affect well adjusted, pleasant and cooperative. Orientation: Grossly alert and oriented. Vocal Quality: No hoarseness. Communicates verbally. Head and Face: NCAT. Right facial asymmetry due to swelling involving inferior orbit extending to right nasolabial fold to maxilla with tenderness. No visible skin lesions. No significant facial scars.    No gross purulence or palpable abscess.  Facial strength normal and symmetric. Ears: External ears with normal landmarks, no lesions. External auditory canals free of infection, cerumen impaction or lesions. Tympanic membranes intact with good  landmarks and normal mobility on pneumatic otoscopy. No middle ear effusion. Hearing: Speech reception grossly normal. Nose: External nose normal with midline dorsum and no lesions or deformity. Nasal Cavity reveals essentially midline septum with normal inferior turbinates. No significant mucosal congestion or erythema. Nasal secretions are minimal and clear. No polyps seen on anterior rhinoscopy. Oral Cavity/ Oropharynx: Induration and swelling near right maxilla near first pre-molar with extension superiorly into midface.  No obvious fluctuance.  Soft supple floor or mouth Indirect Laryngoscopy/Nasopharyngoscopy: Visualization of the larynx, hypopharynx and nasopharynx is not possible in this setting with routine examination. Neck: Supple and symmetric with no palpable masses or crepitance. The trachea is midline. Thyroid gland is soft, nontender and symmetric with no masses or enlargement. Parotid and submandibular glands are soft, nontender and symmetric, without masses. Lymphatic: Cervical lymph nodes are without palpable lymphadenopathy or tenderness. Respiratory: Normal respiratory effort without labored breathing. Cardiovascular: Carotid pulse shows regular rate and rhythm Neurologic: Cranial Nerves II through XII are grossly intact. Eyes: Gaze and Ocular Motility are grossly normal. PERRLA. No visible nystagmus.  CT-  IMPRESSION: 1. Right maxillary first premolar (tooth 5) periapical lucency and 5 mm subperiosteal abscess with surrounding inflammatory changes extending into the subcutaneous space from the angle of the mandible to the right infraorbital margin, consistent with soft tissue infection. 2. Prominent asymmetric soft tissue swelling at the right nasolabial fold. 3. No other discrete abscesses identified.  A/P: Odontogenic subperiosteal abscess 5mm of maxilla with facial cellulitis.  Recommend Unasyn  and Decadron  10mg  q8.  Anticipate resolution with medication and close follow  up with dentist.  No drainable fluid collection at this time.  Will re-evaluate in the morning.  Patient wishes to be discharged and fly to [facility] tomorrow but understands this depends on response to medication.   Taylor Copeland Taylor Copeland 01/01/2024 12:27 PM

## 2024-01-02 LAB — BASIC METABOLIC PANEL WITH GFR
Anion gap: 6 (ref 5–15)
BUN: 16 mg/dL (ref 6–20)
CO2: 24 mmol/L (ref 22–32)
Calcium: 9.1 mg/dL (ref 8.9–10.3)
Chloride: 108 mmol/L (ref 98–111)
Creatinine, Ser: 0.55 mg/dL (ref 0.44–1.00)
GFR, Estimated: >60 mL/min (ref >60)
Glucose, Bld: 165 mg/dL — ABNORMAL HIGH (ref 70–99)
Potassium: 3.7 mmol/L (ref 3.5–5.1)
Sodium: 138 mmol/L (ref 135–145)

## 2024-01-02 LAB — HIV ANTIBODY (ROUTINE TESTING W REFLEX): HIV Screen 4th Generation wRfx: NONREACTIVE

## 2024-01-02 LAB — CBC
HCT: 38.8 % (ref 36.0–46.0)
Hemoglobin: 13.3 g/dL (ref 12.0–15.0)
MCH: 31 pg (ref 26.0–34.0)
MCHC: 34.3 g/dL (ref 30.0–36.0)
MCV: 90.4 fL (ref 80.0–100.0)
Platelets: 210 10*3/uL (ref 150–400)
RBC: 4.29 MIL/uL (ref 3.87–5.11)
RDW: 11.9 % (ref 11.5–15.5)
WBC: 12.3 10*3/uL — ABNORMAL HIGH (ref 4.0–10.5)
nRBC: 0 % (ref 0.0–0.2)

## 2024-01-02 MED ORDER — PREDNISONE 10 MG (21) PO TBPK: ORAL_TABLET | ORAL | 0 refills | Status: AC

## 2024-01-02 MED ORDER — AMOXICILLIN-POT CLAVULANATE 875-125 MG PO TABS: 1.0000 | ORAL_TABLET | Freq: Two times a day (BID) | ORAL | 0 refills | Status: AC

## 2024-01-02 NOTE — TOC CM/SW Note (Signed)
 Transition of Care [facility]) - Inpatient Brief Assessment   Patient Details  Name: Taylor Copeland MRN: [id number] Date of Birth: 01-19-1976  [facility] CM/SW Contact:    Odilia Bennett, LCSW Phone Number: 01/02/2024, 10:28 AM   Clinical Narrative: Patient has orders to discharge home today. Chart reviewed. No TOC needs identified. CSW signing off.  Transition of Care Asessment: Insurance and Status: Insurance coverage has been reviewed Patient has primary care physician: Yes Home environment has been reviewed: Single family home Prior level of function:: Not documented Prior/Current Home Services: No current home services Social Drivers of Health Review: SDOH reviewed no interventions necessary Readmission risk has been reviewed: Yes Transition of care needs: no transition of care needs at this time

## 2024-01-02 NOTE — Plan of Care (Signed)
   Problem: Health Behavior/Discharge Planning: Goal: Ability to manage health-related needs will improve Outcome: Progressing

## 2024-01-02 NOTE — Discharge Summary (Signed)
 Physician Discharge Summary   Patient: Taylor Copeland MRN: [id number] DOB: 09/07/1975  Admit date:     01/01/2024  Discharge date: 01/02/24  Discharge Physician: Ezzard Holms   PCP: Melchor Spoon, MD   Recommendations at discharge:  Follow-up with dentist  Discharge Diagnoses: Dental abscess Facial cellulitis H/o neurocardiogenic syncope Anxiety/depression  Hospital Course: Taylor Copeland is a 48 y.o. female with PMH significant for neurocardiogenic syncope. Patient presented to the ED  with complaint of right-sided facial swelling.  She has known infected tooth and waiting to see a dentist. No fever.  No difficulty swallowing.  She has a Radiation protection practitioner who did some work on the right upper part of her tooth a few weeks ago.  She is waiting for additional work.   In the ED, afebrile, hemodynamically stable. Labs showed multiple CBC, BMP. CT soft tissue neck showed right maxillary first premolar (tooth 5) periapical lucency and 5 mm subperiosteal abscess with surrounding inflammatory changes extending into the subcutaneous space from the angle of the mandible to the right infraorbital margin, consistent with soft tissue infection.  Patient was seen by ENT surgeon with recommendation for antibiotics as well as steroid therapy.  Symptoms are markedly improving therefore cleared by ENT surgeon for discharge today to follow-up with her dentist  Assessment and Plan:  Consultants: ENT Procedures performed: None Disposition: Home Diet recommendation:  Cardiac diet DISCHARGE MEDICATION: Allergies as of 01/02/2024   No Known Allergies      Medication List     TAKE these medications    acebutolol 200 MG capsule Commonly known as: SECTRAL Take 200 mg by mouth 2 (two) times daily.   acetaminophen  500 MG tablet Commonly known as: TYLENOL  Take 2 tablets (1,000 mg total) by mouth every 6 (six) hours as needed for mild pain.   albuterol 108 (90 Base) MCG/ACT inhaler Commonly  known as: VENTOLIN HFA Inhale 1-2 puffs into the lungs every 6 (six) hours as needed for shortness of breath or wheezing.   amoxicillin-clavulanate 875-125 MG tablet Commonly known as: AUGMENTIN Take 1 tablet by mouth 2 (two) times daily for 7 days.   atorvastatin 40 MG tablet Commonly known as: LIPITOR Take 40 mg by mouth daily.   flecainide 100 MG tablet Commonly known as: TAMBOCOR Take 100 mg by mouth 2 (two) times daily.   ibuprofen  800 MG tablet Commonly known as: ADVIL  Take 1 tablet (800 mg total) by mouth every 8 (eight) hours as needed for moderate pain.   midodrine 10 MG tablet Commonly known as: PROAMATINE Take 10 mg by mouth 3 (three) times daily.   ondansetron  4 MG tablet Commonly known as: ZOFRAN  Take 4 mg by mouth every 8 (eight) hours as needed for nausea or vomiting.   predniSONE 10 MG (21) Tbpk tablet Commonly known as: STERAPRED UNI-PAK 21 TAB Take 2 tablets (20 mg total) by mouth 2 (two) times daily for 3 days, THEN 1 tablet (10 mg total) 2 (two) times daily for 3 days, THEN 1 tablet (10 mg total) daily for 3 days. Start taking on: January 02, 2024   promethazine  25 MG suppository Commonly known as: PHENERGAN  Place 1 suppository (25 mg total) rectally every 6 (six) hours as needed for nausea or vomiting.   sertraline 50 MG tablet Commonly known as: ZOLOFT Take 75 mg by mouth daily.   sucralfate  1 g tablet Commonly known as: Carafate  Take 1 tablet (1 g total) by mouth 4 (four) times daily as needed (  for abdominal discomfort, nausea, and/or vomiting).        Discharge Exam: Filed Weights   01/01/24 0435  Weight: 70.3 kg   General exam: Pleasant, middle-aged female Skin: No rashes, lesions or ulcers. HEENT: Atraumatic, normocephalic, no obvious bleeding.  Patient has swelling resolved Lungs: Clear to auscultation bilaterally,  CVS: S1, S2, no murmur,   GI/Abd: Soft, nontender, nondistended, bowel sound present,   CNS: Alert, awake, oriented x  3 Psychiatry: Mood appropriate,  Extremities: No pedal edema, no calf tenderness,   Condition at discharge: good  The results of significant diagnostics from this hospitalization (including imaging, microbiology, ancillary and laboratory) are listed below for reference.   Imaging Studies: CT Soft Tissue Neck W Contrast Result Date: 01/01/2024 EXAM: CT NECK WITH CONTRAST 01/01/2024 06:13:28 AM TECHNIQUE: CT of the neck was performed with the administration of intravenous contrast. Multiplanar reformatted images are provided for review. Automated exposure control, iterative reconstruction, and/or weight based adjustment of the mA/kV was utilized to reduce the radiation dose to as low as reasonably achievable. CONTRAST: 75mL iohexol  (OMNIPAQUE ) 300 MG/ML solution COMPARISON: None available. CLINICAL HISTORY: Soft tissue infection suspected, neck, xray done. Table formatting from the original note was not included. Images from the original note were not included. Pt reports she woke up this morning with right side facial swelling, pt has known infected tooth, waiting to see dentist. FINDINGS: PHARYNX AND LARYNX: The tonsillar pillars are normal in appearance. The tongue is normal in appearance. The valleculae, epiglottis, aryepiglottic folds and pyriform sinuses appear unremarkable. The true and false vocal cords are normal in appearance. No mass or abscess is seen. SALIVARY GLANDS: The parotid and submandibular glands are unremarkable. THYROID: The thyroid gland is unremarkable. No nodule. LYMPH NODES: No lymphadenopathy. SOFT TISSUES: Focal dental caries is present in the right first premolar maxillary tooth (tooth number 5). Periapical lucency is present. A subperiosteal abscess measures 5 mm. Surrounding inflammatory changes extend into the subcutaneous space, extending from the angle of the mandible to the right infraorbital margin. Prominent asymmetric soft tissue swelling is present at the right  nasolabial fold. No other discrete abscesses are present. LIMITED BRAIN, ORBITS AND SINUSES: The orbit is otherwise within normal limits. The visualized portion of the intracranial contents demonstrate no acute abnormality. The visualized portion of the orbits are without acute abnormality. The visualized paranasal sinuses and mastoid air cells demonstrate no acute abnormality. LUNG APICES AND SUPERIOR MEDIASTINUM: No acute abnormality in the visualized lung apices. No superior mediastinal lymphadenopathy or mass. The visualized portion of the trachea appears unremarkable. BONES: Straightening and slight reversal of the normal cervical lordosis are present. No acute osseous abnormality. IMPRESSION: 1. Right maxillary first premolar (tooth 5) periapical lucency and 5 mm subperiosteal abscess with surrounding inflammatory changes extending into the subcutaneous space from the angle of the mandible to the right infraorbital margin, consistent with soft tissue infection. 2. Prominent asymmetric soft tissue swelling at the right nasolabial fold. 3. No other discrete abscesses identified. Electronically signed by: Audree Leas MD 01/01/2024 06:24 AM EDT RP Workstation: [id number]    Microbiology: Results for orders placed or performed during the hospital encounter of 02/11/20  SARS Coronavirus 2 by RT PCR (hospital order, performed in [facility] hospital lab) Nasopharyngeal Nasopharyngeal Swab     Status: None   Collection Time: 02/11/20  3:11 PM   Specimen: Nasopharyngeal Swab  Result Value Ref Range Status   SARS Coronavirus 2 NEGATIVE NEGATIVE Final    Comment: (NOTE) SARS-CoV-2 target  nucleic acids are NOT DETECTED.  The SARS-CoV-2 RNA is generally detectable in upper and lower respiratory specimens during the acute phase of infection. The lowest concentration of SARS-CoV-2 viral copies this assay can detect is 250 copies / mL. A negative result does not preclude SARS-CoV-2 infection and  should not be used as the sole basis for treatment or other patient management decisions.  A negative result may occur with improper specimen collection / handling, submission of specimen other than nasopharyngeal swab, presence of viral mutation(s) within the areas targeted by this assay, and inadequate number of viral copies (<250 copies / mL). A negative result must be combined with clinical observations, patient history, and epidemiological information.  Fact Sheet for Patients:   BoilerBrush.com.cy  Fact Sheet for Healthcare Providers: https://pope.com/  This test is not yet approved or  cleared by the [place]  FDA and has been authorized for detection and/or diagnosis of SARS-CoV-2 by FDA under an Emergency Use Authorization (EUA).  This EUA will remain in effect (meaning this test can be used) for the duration of the COVID-19 declaration under Section 564(b)(1) of the Act, 21 U.S.C. section 360bbb-3(b)(1), unless the authorization is terminated or revoked sooner.  Performed at [facility], [address] [place], [place] [address]     Labs: CBC: Recent Labs  Lab 01/01/24 0506 01/02/24 0537  WBC 8.6 12.3*  NEUTROABS 4.7  --   HGB 13.1 13.3  HCT 38.5 38.8  MCV 93.0 90.4  PLT 196 210   Basic Metabolic Panel: Recent Labs  Lab 01/01/24 0506 01/02/24 0537  NA 143 138  K 4.0 3.7  CL 110 108  CO2 23 24  GLUCOSE 103* 165*  BUN 15 16  CREATININE 0.55 0.55  CALCIUM 8.8* 9.1   Liver Function Tests: No results for input(s): "AST", "ALT", "ALKPHOS", "BILITOT", "PROT", "ALBUMIN" in the last 168 hours. CBG: No results for input(s): "GLUCAP" in the last 168 hours.  Discharge time spent:  36 minutes.  Signed: Ezzard Holms, MD [facility] 01/02/2024

## 2024-01-02 NOTE — Progress Notes (Signed)
 Removed IV-CDI. Reviewed d/c paperwork with patient and answered questions. NT wheeled stable patient and belongings to ED entrance where she had her car to drive home.

## 2024-01-02 NOTE — Progress Notes (Signed)
..  01/02/2024 8:28 AM  Allegra Arch [id number]   Temp:  [97.7 F (36.5 C)-98.7 F (37.1 C)] 97.7 F (36.5 C) (06/06 0825) Pulse Rate:  [68-97] 78 (06/06 0825) Resp:  [16-21] 18 (06/06 0825) BP: (112-129)/(72-90) 129/77 (06/06 0825) SpO2:  [95 %-100 %] 95 % (06/06 0825),    No intake or output data in the 24 hours ending 01/02/24 0828  Results for orders placed or performed during the hospital encounter of 01/01/24 (from the past 24 hours)  Basic metabolic panel     Status: Abnormal   Collection Time: 01/02/24  5:37 AM  Result Value Ref Range   Sodium 138 135 - 145 mmol/L   Potassium 3.7 3.5 - 5.1 mmol/L   Chloride 108 98 - 111 mmol/L   CO2 24 22 - 32 mmol/L   Glucose, Bld 165 (H) 70 - 99 mg/dL   BUN 16 6 - 20 mg/dL   Creatinine, Ser 1.91 0.44 - 1.00 mg/dL   Calcium 9.1 8.9 - 47.8 mg/dL   GFR, Estimated >29 >56 mL/min   Anion gap 6 5 - 15  CBC     Status: Abnormal   Collection Time: 01/02/24  5:37 AM  Result Value Ref Range   WBC 12.3 (H) 4.0 - 10.5 K/uL   RBC 4.29 3.87 - 5.11 MIL/uL   Hemoglobin 13.3 12.0 - 15.0 g/dL   HCT 21.3 08.6 - 57.8 %   MCV 90.4 80.0 - 100.0 fL   MCH 31.0 26.0 - 34.0 pg   MCHC 34.3 30.0 - 36.0 g/dL   RDW 46.9 62.9 - 52.8 %   Platelets 210 150 - 400 K/uL   nRBC 0.0 0.0 - 0.2 %    SUBJECTIVE:  Significant improvement per patient in swelling.  Reports little pain.  Doing well.  Tolerating PO  OBJECTIVE:  GEN-  NAD FACE-  improved right sided edema and induration along right midface  IMPRESSION:  Right odontogenic cellulitis  PLAN:  Cleared for discharge on Augmentin and Sterapred DS 6 day taper.  Follow up with dentist for addressing tooth.  Domenic Schoenberger 01/02/2024, 8:28 AM

## 2024-01-02 NOTE — Plan of Care (Signed)
  Problem: Education: Goal: Knowledge of General Education information will improve Description: Including pain rating scale, medication(s)/side effects and non-pharmacologic comfort measures 01/02/2024 1117 by Apolinar Baxter, RN Outcome: Adequate for Discharge 01/02/2024 1116 by Apolinar Baxter, RN Outcome: Progressing   Problem: Health Behavior/Discharge Planning: Goal: Ability to manage health-related needs will improve 01/02/2024 1117 by Apolinar Baxter, RN Outcome: Adequate for Discharge 01/02/2024 1116 by Apolinar Baxter, RN Outcome: Progressing   Problem: Clinical Measurements: Goal: Ability to maintain clinical measurements within normal limits will improve Outcome: Adequate for Discharge Goal: Will remain free from infection Outcome: Adequate for Discharge Goal: Diagnostic test results will improve Outcome: Adequate for Discharge Goal: Respiratory complications will improve Outcome: Adequate for Discharge Goal: Cardiovascular complication will be avoided Outcome: Adequate for Discharge   Problem: Activity: Goal: Risk for activity intolerance will decrease Outcome: Adequate for Discharge   Problem: Nutrition: Goal: Adequate nutrition will be maintained Outcome: Adequate for Discharge   Problem: Coping: Goal: Level of anxiety will decrease Outcome: Adequate for Discharge   Problem: Elimination: Goal: Will not experience complications related to bowel motility Outcome: Adequate for Discharge Goal: Will not experience complications related to urinary retention Outcome: Adequate for Discharge   Problem: Pain Managment: Goal: General experience of comfort will improve and/or be controlled Outcome: Adequate for Discharge   Problem: Safety: Goal: Ability to remain free from injury will improve Outcome: Adequate for Discharge   Problem: Skin Integrity: Goal: Risk for impaired skin integrity will decrease Outcome: Adequate for Discharge
